# Patient Record
Sex: Female | Born: 1985 | Race: White | Hispanic: No | Marital: Married | Smoking: Never smoker
Health system: Southern US, Community
[De-identification: ages and names within clinical notes are randomized; demographics above are authoritative.]

## PROBLEM LIST (undated history)

## (undated) ENCOUNTER — Inpatient Hospital Stay (HOSPITAL_COMMUNITY): Payer: Self-pay

## (undated) DIAGNOSIS — E119 Type 2 diabetes mellitus without complications: Secondary | ICD-10-CM

## (undated) DIAGNOSIS — O24419 Gestational diabetes mellitus in pregnancy, unspecified control: Secondary | ICD-10-CM

## (undated) DIAGNOSIS — J45909 Unspecified asthma, uncomplicated: Secondary | ICD-10-CM

## (undated) HISTORY — PX: UPPER GI ENDOSCOPY: SHX6162

## (undated) HISTORY — DX: Unspecified asthma, uncomplicated: J45.909

## (undated) HISTORY — PX: COLONOSCOPY: SHX174

---
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
## 2017-01-31 ENCOUNTER — Ambulatory Visit (INDEPENDENT_AMBULATORY_CARE_PROVIDER_SITE_OTHER): Admitting: Obstetrics & Gynecology

## 2017-01-31 ENCOUNTER — Encounter (INDEPENDENT_AMBULATORY_CARE_PROVIDER_SITE_OTHER): Payer: Self-pay

## 2017-01-31 ENCOUNTER — Encounter: Payer: Self-pay | Admitting: Obstetrics & Gynecology

## 2017-01-31 VITALS — BP 116/71 | HR 100 | Ht 66.0 in | Wt 149.0 lb

## 2017-01-31 DIAGNOSIS — Z3481 Encounter for supervision of other normal pregnancy, first trimester: Secondary | ICD-10-CM | POA: Diagnosis not present

## 2017-01-31 DIAGNOSIS — Z3480 Encounter for supervision of other normal pregnancy, unspecified trimester: Secondary | ICD-10-CM

## 2017-01-31 DIAGNOSIS — Z113 Encounter for screening for infections with a predominantly sexual mode of transmission: Secondary | ICD-10-CM

## 2017-01-31 DIAGNOSIS — Z3687 Encounter for antenatal screening for uncertain dates: Secondary | ICD-10-CM | POA: Diagnosis not present

## 2017-01-31 DIAGNOSIS — Z3491 Encounter for supervision of normal pregnancy, unspecified, first trimester: Secondary | ICD-10-CM

## 2017-01-31 DIAGNOSIS — O0993 Supervision of high risk pregnancy, unspecified, third trimester: Secondary | ICD-10-CM | POA: Insufficient documentation

## 2017-01-31 MED ORDER — PROMETHAZINE HCL 25 MG PO TABS: 25.0000 mg | ORAL_TABLET | Freq: Four times a day (QID) | ORAL | 2 refills | Status: DC | PRN

## 2017-01-31 NOTE — Progress Notes (Signed)
Last pap 2017- normal results

## 2017-01-31 NOTE — Progress Notes (Signed)
Bedside U/S shows IUP with FHT of 163 BPM and CRL 27.448mm

## 2017-01-31 NOTE — Progress Notes (Signed)
  Subjective:    Caroline Williamson is being seen today for her first obstetrical visit.  This is a planned pregnancy. She is at 2773w4d gestation. Her obstetrical history is significant for none. Relationship with FOB: significant other, not living together. Patient does intend to breast feed. Pregnancy history fully reviewed.  Patient reports constipation and nausea..  Review of Systems:   Review of Systems  Objective:     BP 116/71   Pulse 100   Ht 5\' 6"  (1.676 m)   Wt 149 lb (67.6 kg)   LMP 11/18/2016   BMI 24.05 kg/m  Physical Exam  Exam    Assessment:    Pregnancy: [id number] Patient Active Problem List   Diagnosis Date Noted  . Supervision of other normal pregnancy, antepartum 01/31/2017       Plan:     Initial labs drawn. Prenatal vitamins. Problem list reviewed and updated. AFP3 discussed: declined. Role of ultrasound in pregnancy discussed; fetal survey: requested. Amniocentesis discussed: not indicated. Follow up in 4 weeks. Rec Miralax Phenergan prn   Caroline Williamson 01/31/2017

## 2017-02-02 LAB — CULTURE, URINE COMPREHENSIVE
MICRO NUMBER:: 81259460
SPECIMEN QUALITY:: ADEQUATE

## 2017-02-04 LAB — GC/CHLAMYDIA PROBE AMP (~~LOC~~) NOT AT ARMC
CHLAMYDIA, DNA PROBE: NEGATIVE
NEISSERIA GONORRHEA: NEGATIVE

## 2017-02-08 LAB — CYSTIC FIBROSIS DIAGNOSTIC STUDY

## 2017-02-08 LAB — OBSTETRIC PANEL
ANTIBODY SCREEN: NOT DETECTED
HEP B S AG: NONREACTIVE
RPR Ser Ql: NONREACTIVE

## 2017-02-08 LAB — HIV ANTIBODY (ROUTINE TESTING W REFLEX): HIV 1&2 Ab, 4th Generation: NONREACTIVE

## 2017-03-01 ENCOUNTER — Ambulatory Visit (INDEPENDENT_AMBULATORY_CARE_PROVIDER_SITE_OTHER): Admitting: Certified Nurse Midwife

## 2017-03-01 VITALS — BP 115/78 | HR 103 | Wt 151.0 lb

## 2017-03-01 DIAGNOSIS — O09899 Supervision of other high risk pregnancies, unspecified trimester: Secondary | ICD-10-CM

## 2017-03-01 DIAGNOSIS — Z2839 Other underimmunization status: Secondary | ICD-10-CM | POA: Insufficient documentation

## 2017-03-01 DIAGNOSIS — O9989 Other specified diseases and conditions complicating pregnancy, childbirth and the puerperium: Secondary | ICD-10-CM

## 2017-03-01 DIAGNOSIS — Z3480 Encounter for supervision of other normal pregnancy, unspecified trimester: Secondary | ICD-10-CM

## 2017-03-01 DIAGNOSIS — Z3482 Encounter for supervision of other normal pregnancy, second trimester: Secondary | ICD-10-CM

## 2017-03-01 DIAGNOSIS — Z283 Underimmunization status: Secondary | ICD-10-CM

## 2017-03-01 NOTE — Patient Instructions (Signed)
Second Trimester of Pregnancy The second trimester is from week 13 through week 28, month 4 through 6. This is often the time in pregnancy that you feel your best. Often times, morning sickness has lessened or quit. You may have more energy, and you may get hungry more often. Your unborn baby (fetus) is growing rapidly. At the end of the sixth month, he or she is about 9 inches long and weighs about 1 pounds. You will likely feel the baby move (quickening) between 18 and 20 weeks of pregnancy. Follow these instructions at home:  Avoid all smoking, herbs, and alcohol. Avoid drugs not approved by your doctor.  Do not use any tobacco products, including cigarettes, chewing tobacco, and electronic cigarettes. If you need help quitting, ask your doctor. You may get counseling or other support to help you quit.  Only take medicine as told by your doctor. Some medicines are safe and some are not during pregnancy.  Exercise only as told by your doctor. Stop exercising if you start having cramps.  Eat regular, healthy meals.  Wear a good support bra if your breasts are tender.  Do not use hot tubs, steam rooms, or saunas.  Wear your seat belt when driving.  Avoid raw meat, uncooked cheese, and liter boxes and soil used by cats.  Take your prenatal vitamins.  Take 1500-2000 milligrams of calcium daily starting at the 20th week of pregnancy until you deliver your baby.  Try taking medicine that helps you poop (stool softener) as needed, and if your doctor approves. Eat more fiber by eating fresh fruit, vegetables, and whole grains. Drink enough fluids to keep your pee (urine) clear or pale yellow.  Take warm water baths (sitz baths) to soothe pain or discomfort caused by hemorrhoids. Use hemorrhoid cream if your doctor approves.  If you have puffy, bulging veins (varicose veins), wear support hose. Raise (elevate) your feet for 15 minutes, 3-4 times a day. Limit salt in your diet.  Avoid heavy  lifting, wear low heals, and sit up straight.  Rest with your legs raised if you have leg cramps or low back pain.  Visit your dentist if you have not gone during your pregnancy. Use a soft toothbrush to brush your teeth. Be gentle when you floss.  You can have sex (intercourse) unless your doctor tells you not to.  Go to your doctor visits. Get help if:  You feel dizzy.  You have mild cramps or pressure in your lower belly (abdomen).  You have a nagging pain in your belly area.  You continue to feel sick to your stomach (nauseous), throw up (vomit), or have watery poop (diarrhea).  You have bad smelling fluid coming from your vagina.  You have pain with peeing (urination). Get help right away if:  You have a fever.  You are leaking fluid from your vagina.  You have spotting or bleeding from your vagina.  You have severe belly cramping or pain.  You lose or gain weight rapidly.  You have trouble catching your breath and have chest pain.  You notice sudden or extreme puffiness (swelling) of your face, hands, ankles, feet, or legs.  You have not felt the baby move in over an hour.  You have severe headaches that do not go away with medicine.  You have vision changes. This information is not intended to replace advice given to you by your health care provider. Make sure you discuss any questions you have with your health care   provider. Document Released: 06/06/2009 Document Revised: 08/18/2015 Document Reviewed: 05/13/2012 Elsevier Interactive Patient Education  2017 [employer]vier Inc[employer]  

## 2017-03-01 NOTE — Progress Notes (Signed)
Subjective:  Caroline Williamson is a 31 y.o. G3P2002 at 31w5dbeing seen today for ongoing prenatal care.  She is currently monitored for the following issues for this low-risk pregnancy and has Supervision of other normal pregnancy, antepartum and Rubella non-immune status, antepartum on their problem list.  Patient reports no complaints.   . Vag. Bleeding: None.   . Denies leaking of fluid.   The following portions of the patient's history were reviewed and updated as appropriate: allergies, current medications, past family history, past medical history, past social history, past surgical history and problem list. Problem list updated.  Objective:   Vitals:   03/01/17 1045  BP: 115/78  Pulse: (!) 103  Weight: 151 lb (68.5 kg)    Fetal Status: Fetal Heart Rate (bpm): 157         General:  Alert, oriented and cooperative. Patient is in no acute distress.  Skin: Skin is warm and dry. No rash noted.   Cardiovascular: Normal heart rate noted  Respiratory: Normal respiratory effort, no problems with respiration noted  Abdomen: Soft, gravid, appropriate for gestational age. Pain/Pressure: Absent     Pelvic: Vag. Bleeding: None Vag D/C Character: Thin   Cervical exam deferred        Extremities: Normal range of motion.  Edema: None  Mental Status: Normal mood and affect. Normal behavior. Normal judgment and thought content.   Urinalysis: Urine Protein: Negative Urine Glucose: Negative  Assessment and Plan:  Pregnancy: G3P2002 at 12w5d1. Rubella non-immune status, antepartum - MMR pp  2. Encounter for supervision of other normal pregnancy in second trimester - [place] OB COMP + 14 WK; Future - nausea resolving, changing diet helped, B6 prn  Preterm labor symptoms and general obstetric precautions including but not limited to vaginal bleeding, contractions, leaking of fluid and fetal movement were reviewed in detail with the patient. Please refer to After Visit Summary for other  counseling recommendations.  Return in about 4 weeks (around 03/29/2017).   BhJulianne HandlerCNM

## 2017-03-26 NOTE — L&D Delivery Note (Signed)
Patient is 32 y.o. [id number] 10161w2d admitted IOL for A2GDM, EFW 8 lb 4 oz (>90%) @ 36.2. S/P FB in office, IV pitocin,     Delivery Note At 8:32 PM a viable female was delivered via  (Presentation: OA).  APGAR: pending; Placenta status: delivered intact .  Cord:3V   Anesthesia:  Epidural Episiotomy:  No Lacerations:  None Est. Blood Loss (mL):  10 mL  Upon arrival patient was complete and pushing. She pushed with good maternal effort to deliver a healthy baby girl. Baby delivered without difficulty, was noted to have good tone and place on maternal abdomen for oral suctioning, drying and stimulation. Delayed cord clamping performed. Placenta delivered intact with 3V cord. Vaginal canal and perineum was inspected and no lacerations were observed; hemostatic. Pitocin was started and uterus massaged until bleeding slowed. Counts of sharps, instruments, and lap pads were all correct.   Mom to postpartum.  Baby to Couplet care / Skin to Skin.  Garnette Gunneraron B Thompson 08/27/2017, 8:43 PM

## 2017-03-29 ENCOUNTER — Encounter: Payer: Self-pay | Admitting: Certified Nurse Midwife

## 2017-03-29 ENCOUNTER — Ambulatory Visit (INDEPENDENT_AMBULATORY_CARE_PROVIDER_SITE_OTHER): Payer: Self-pay | Admitting: Certified Nurse Midwife

## 2017-03-29 DIAGNOSIS — J4521 Mild intermittent asthma with (acute) exacerbation: Secondary | ICD-10-CM

## 2017-03-29 DIAGNOSIS — J45901 Unspecified asthma with (acute) exacerbation: Secondary | ICD-10-CM | POA: Insufficient documentation

## 2017-03-29 DIAGNOSIS — Z3480 Encounter for supervision of other normal pregnancy, unspecified trimester: Secondary | ICD-10-CM

## 2017-03-29 MED ORDER — ALBUTEROL SULFATE HFA 108 (90 BASE) MCG/ACT IN AERS: 1.0000 | INHALATION_SPRAY | Freq: Four times a day (QID) | RESPIRATORY_TRACT | 2 refills | Status: AC | PRN

## 2017-03-29 NOTE — Progress Notes (Signed)
`  Subjective:  Caroline Williamson is a 32 y.o. G3P2002 at 668w5d being seen today for ongoing prenatal care.  She is currently monitored for the following issues for this low-risk pregnancy and has Supervision of other normal pregnancy, antepartum; Rubella non-immune status, antepartum; and Asthma exacerbation on their problem list.  Patient reports SOB and chest tightness over the last week. Nasal congestion and nonproductive cough present. No fevers, chills, or body aches..  Contractions: Not present. Vag. Bleeding: None.  Movement: Present. Denies leaking of fluid.   The following portions of the patient's history were reviewed and updated as appropriate: allergies, current medications, past family history, past medical history, past social history, past surgical history and problem list. Problem list updated.  Objective:   Vitals:   03/29/17 0842  BP: 126/76  Pulse: (!) 109  Weight: 158 lb (71.7 kg)    Fetal Status: Fetal Heart Rate (bpm): 148 Fundal Height: 17 cm Movement: Present     General:  Alert, oriented and cooperative. Patient is in no acute distress.  Skin: Skin is warm and dry. No rash noted.   Cardiovascular: RRR  Respiratory: Normal respiratory effort, CTAB  Abdomen: Soft, gravid, appropriate for gestational age. Pain/Pressure: Absent     Pelvic: Vag. Bleeding: None Vag D/C Character: Thin   Cervical exam deferred        Extremities: Normal range of motion.  Edema: None  Mental Status: Normal mood and affect. Normal behavior. Normal judgment and thought content.   Urinalysis: Urine Protein: 1+ Urine Glucose: Negative  Assessment and Plan:  Pregnancy: G3P2002 at 608w5d  1. Supervision of other normal pregnancy, antepartum - Second trimester anticipatory guidance  2. Mild intermittent asthma with exacerbation - Hx of asthma - Rx Proventil inhaler prn  Preterm labor symptoms and general obstetric precautions including but not limited to vaginal bleeding, contractions,  leaking of fluid and fetal movement were reviewed in detail with the patient. Please refer to After Visit Summary for other counseling recommendations.  Return in about 4 weeks (around 04/26/2017).   Donette LarryBhambri, Arlene Genova, CNM

## 2017-03-29 NOTE — Progress Notes (Signed)
Pt c/o having a  hard time breathing

## 2017-04-03 ENCOUNTER — Encounter (HOSPITAL_COMMUNITY): Payer: Self-pay | Admitting: Certified Nurse Midwife

## 2017-04-08 ENCOUNTER — Other Ambulatory Visit (HOSPITAL_COMMUNITY)

## 2017-04-26 ENCOUNTER — Encounter: Payer: Self-pay | Admitting: Advanced Practice Midwife

## 2017-06-20 ENCOUNTER — Ambulatory Visit (INDEPENDENT_AMBULATORY_CARE_PROVIDER_SITE_OTHER): Payer: Medicaid Other | Admitting: Obstetrics & Gynecology

## 2017-06-20 VITALS — BP 128/77 | HR 115 | Wt 172.0 lb

## 2017-06-20 DIAGNOSIS — Z3482 Encounter for supervision of other normal pregnancy, second trimester: Secondary | ICD-10-CM | POA: Diagnosis not present

## 2017-06-20 DIAGNOSIS — Z3480 Encounter for supervision of other normal pregnancy, unspecified trimester: Secondary | ICD-10-CM

## 2017-06-20 NOTE — Progress Notes (Signed)
   PRENATAL VISIT NOTE  Subjective:  Caroline Williamson is a 32 y.o. G3P2002 at 8271w4d being seen today for ongoing prenatal care.  She is currently monitored for the following issues for this low-risk pregnancy and has Supervision of other normal pregnancy, antepartum; Rubella non-immune status, antepartum; and Asthma exacerbation on their problem list.  Patient reports some contractions, lots of stress.  Contractions: Not present. Vag. Bleeding: None.  Movement: Present. Denies leaking of fluid.   The following portions of the patient's history were reviewed and updated as appropriate: allergies, current medications, past family history, past medical history, past social history, past surgical history and problem list. Problem list updated.  Objective:   Vitals:   06/20/17 1342  BP: 128/77  Pulse: (!) 115  Weight: 172 lb (78 kg)    Fetal Status:     Movement: Present     General:  Alert, oriented and cooperative. Patient is in no acute distress.  Skin: Skin is warm and dry. No rash noted.   Cardiovascular: Normal heart rate noted  Respiratory: Normal respiratory effort, no problems with respiration noted  Abdomen: Soft, gravid, appropriate for gestational age.  Pain/Pressure: Present     Pelvic: Cervical exam performed        Extremities: Normal range of motion.  Edema: Trace  Mental Status:  Normal mood and affect. Normal behavior. Normal judgment and thought content.   Assessment and Plan:  Pregnancy: G3P2002 at 5571w4d  1. Supervision of other normal pregnancy, antepartum  - CBC - HIV antibody (with reflex) - RPR - Glucose Tolerance, 1 HR (50g)  Preterm labor symptoms and general obstetric precautions including but not limited to vaginal bleeding, contractions, leaking of fluid and fetal movement were reviewed in detail with the patient. Please refer to After Visit Summary for other counseling recommendations.  No follow-ups on file.   Allie BossierMyra C Berdell Hostetler, MD

## 2017-06-21 ENCOUNTER — Telehealth: Payer: Self-pay | Admitting: *Deleted

## 2017-06-21 LAB — CBC
HEMATOCRIT: 28 % — AB (ref 35.0–45.0)
HEMOGLOBIN: 9.2 g/dL — AB (ref 11.7–15.5)
MCH: 26.1 pg — AB (ref 27.0–33.0)
MCHC: 32.9 g/dL (ref 32.0–36.0)
MCV: 79.3 fL — ABNORMAL LOW (ref 80.0–100.0)
MPV: 10.1 fL (ref 7.5–12.5)
Platelets: 349 10*3/uL (ref 140–400)
RBC: 3.53 10*6/uL — AB (ref 3.80–5.10)
RDW: 13 % (ref 11.0–15.0)
WBC: 8.7 10*3/uL (ref 3.8–10.8)

## 2017-06-21 LAB — RPR: RPR Ser Ql: NONREACTIVE

## 2017-06-21 LAB — HIV ANTIBODY (ROUTINE TESTING W REFLEX): HIV 1&2 Ab, 4th Generation: NONREACTIVE

## 2017-06-21 LAB — GLUCOSE TOLERANCE, 1 HOUR (50G) W/O FASTING: Glucose, 1 Hr, gestational: 166 mg/dL — ABNORMAL HIGH (ref 65–139)

## 2017-06-21 NOTE — Telephone Encounter (Signed)
-----   Message from Allie BossierMyra C Dove, MD sent at 06/21/2017  7:57 AM EDT ----- Abnormal 1 hour  I recommend a 3 hour GTT

## 2017-06-21 NOTE — Telephone Encounter (Signed)
Pt notified of abnormal 1 hr GTT.  Pt is to come in Tuesday morning for a fasting 3 hr GTT per Dr Marice Potterove.

## 2017-06-24 ENCOUNTER — Telehealth: Payer: Self-pay | Admitting: *Deleted

## 2017-06-24 NOTE — Telephone Encounter (Signed)
-----   Message from Allie BossierMyra C Dove, MD sent at 06/24/2017  8:16 AM EDT ----- Please recommend that she take iron daily. Thanks

## 2017-06-25 ENCOUNTER — Other Ambulatory Visit: Payer: Medicaid Other

## 2017-06-25 DIAGNOSIS — R7309 Other abnormal glucose: Secondary | ICD-10-CM

## 2017-06-26 LAB — GLUCOSE TOLERANCE, 3 HOURS
GLUCOSE 3 HOUR GTT: 136 mg/dL (ref 65–144)
GLUCOSE, 1 HOUR-GESTATIONAL: 291 mg/dL — AB (ref 65–189)
GLUCOSE, FASTING-GESTATIONAL: 111 mg/dL — AB (ref 65–104)
Glucose Tolerance, 2 hour: 233 mg/dL — ABNORMAL HIGH (ref 65–164)

## 2017-06-27 ENCOUNTER — Ambulatory Visit (INDEPENDENT_AMBULATORY_CARE_PROVIDER_SITE_OTHER): Payer: Medicaid Other | Admitting: Obstetrics & Gynecology

## 2017-06-27 VITALS — BP 114/69 | HR 104 | Wt 173.0 lb

## 2017-06-27 DIAGNOSIS — O24414 Gestational diabetes mellitus in pregnancy, insulin controlled: Secondary | ICD-10-CM

## 2017-06-27 MED ORDER — ACCU-CHEK NANO SMARTVIEW W/DEVICE KIT: 1.0000 | PACK | 0 refills | Status: DC

## 2017-06-27 MED ORDER — ACCU-CHEK FASTCLIX LANCETS MISC: 1.0000 [IU] | Freq: Four times a day (QID) | 12 refills | Status: DC

## 2017-06-27 MED ORDER — INSULIN REGULAR HUMAN 100 UNIT/ML IJ SOLN: INTRAMUSCULAR | 11 refills | Status: DC

## 2017-06-27 MED ORDER — GLUCOSE BLOOD VI STRP: ORAL_STRIP | 12 refills | Status: DC

## 2017-06-27 MED ORDER — INSULIN SYRINGES (DISPOSABLE) U-100 0.5 ML MISC: 1.0000 | Freq: Three times a day (TID) | 12 refills | Status: DC

## 2017-06-27 MED ORDER — INSULIN NPH (HUMAN) (ISOPHANE) 100 UNIT/ML ~~LOC~~ SUSP: SUBCUTANEOUS | 3 refills | Status: DC

## 2017-06-27 NOTE — Addendum Note (Signed)
Addended by: Allie BossierVE, [place] C on: 06/27/2017 01:46 PM   Modules accepted: Orders

## 2017-06-27 NOTE — Progress Notes (Signed)
I have ordered weekly BPP starting at 32 weeks She never got her anatomy u/s done, so I have re ordered it.

## 2017-06-27 NOTE — Progress Notes (Signed)
She is here because her 3 hour results were so high that she needs insulin. Based on her weight, she will need 30 units of NPH in the morning and 11 of NPH at bedtime + 16 regular with breakfast and 11 with supper.

## 2017-07-04 ENCOUNTER — Ambulatory Visit (INDEPENDENT_AMBULATORY_CARE_PROVIDER_SITE_OTHER): Payer: Medicaid Other | Admitting: Obstetrics & Gynecology

## 2017-07-04 VITALS — BP 123/74 | HR 106 | Wt 171.0 lb

## 2017-07-04 DIAGNOSIS — O24414 Gestational diabetes mellitus in pregnancy, insulin controlled: Secondary | ICD-10-CM | POA: Diagnosis not present

## 2017-07-04 DIAGNOSIS — Z3480 Encounter for supervision of other normal pregnancy, unspecified trimester: Secondary | ICD-10-CM

## 2017-07-04 NOTE — Progress Notes (Signed)
   PRENATAL VISIT NOTE  Subjective:  Caroline Williamson is a 32 y.o. G3P2002 at 3127w4d being seen today for ongoing prenatal care.  She is currently monitored for the following issues for this high-risk pregnancy and has Supervision of other normal pregnancy, antepartum; Rubella non-immune status, antepartum; and Asthma exacerbation on their problem list.  Patient reports no complaints.  Contractions: Not present. Vag. Bleeding: None.  Movement: Present. Denies leaking of fluid.   The following portions of the patient's history were reviewed and updated as appropriate: allergies, current medications, past family history, past medical history, past social history, past surgical history and problem list. Problem list updated.  Objective:   Vitals:   07/04/17 1350  BP: 123/74  Pulse: (!) 106  Weight: 171 lb (77.6 kg)    Fetal Status: Fetal Heart Rate (bpm): 147   Movement: Present     General:  Alert, oriented and cooperative. Patient is in no acute distress.  Skin: Skin is warm and dry. No rash noted.   Cardiovascular: Normal heart rate noted  Respiratory: Normal respiratory effort, no problems with respiration noted  Abdomen: Soft, gravid, appropriate for gestational age.  Pain/Pressure: Present     Pelvic: Cervical exam deferred        Extremities: Normal range of motion.  Edema: Trace  Mental Status: Normal mood and affect. Normal behavior. Normal judgment and thought content.   Assessment and Plan:  Pregnancy: G3P2002 at 3127w4d  1. Supervision of other normal pregnancy, antepartum 2. Insulin dependent GDM- anatomy u/s scheduled for 07-10-17, BPP ordered and scheduled for 07-10-17 - She is scheduled for diabetes teaching 07-17-17  Preterm labor symptoms and general obstetric precautions including but not limited to vaginal bleeding, contractions, leaking of fluid and fetal movement were reviewed in detail with the patient. Please refer to After Visit Summary for other counseling  recommendations.  No follow-ups on file.  Future Appointments  Date Time Provider Department Center  07/10/2017  8:00 AM [facility] US 3 [facility]  07/17/2017  8:45 AM [facility] GDM CLASS [facility] NDM    Caroline BossierMyra C Kaila Devries, MD

## 2017-07-05 LAB — UNLABELED: TEST ORDERED ON REQ: 16802

## 2017-07-08 ENCOUNTER — Encounter (HOSPITAL_COMMUNITY): Payer: Self-pay

## 2017-07-10 ENCOUNTER — Ambulatory Visit (HOSPITAL_COMMUNITY)
Admission: RE | Admit: 2017-07-10 | Discharge: 2017-07-10 | Disposition: A | Discharge status: Home / Self Care | Payer: Medicaid Other | Source: Ambulatory Visit | Attending: Obstetrics & Gynecology | Admitting: Obstetrics & Gynecology

## 2017-07-10 ENCOUNTER — Other Ambulatory Visit: Payer: Self-pay | Admitting: Obstetrics & Gynecology

## 2017-07-10 DIAGNOSIS — Z3689 Encounter for other specified antenatal screening: Secondary | ICD-10-CM

## 2017-07-10 DIAGNOSIS — Z3A33 33 weeks gestation of pregnancy: Secondary | ICD-10-CM

## 2017-07-10 DIAGNOSIS — O24414 Gestational diabetes mellitus in pregnancy, insulin controlled: Secondary | ICD-10-CM

## 2017-07-10 HISTORY — DX: Type 2 diabetes mellitus without complications: E11.9

## 2017-07-10 HISTORY — DX: Gestational diabetes mellitus in pregnancy, unspecified control: O24.419

## 2017-07-10 LAB — PAT ID TIQ DOC: Test Affected: 16802

## 2017-07-10 LAB — HEMOGLOBIN A1C
HEMOGLOBIN A1C: 6.4 %{Hb} — AB (ref <5.7)
MEAN PLASMA GLUCOSE: 137 (calc)
eAG (mmol/L): 7.6 (calc)

## 2017-07-17 ENCOUNTER — Telehealth: Payer: Self-pay | Admitting: *Deleted

## 2017-07-17 ENCOUNTER — Encounter: Payer: Self-pay | Admitting: Registered"

## 2017-07-17 ENCOUNTER — Encounter: Payer: Medicaid Other | Attending: Obstetrics & Gynecology | Admitting: Registered"

## 2017-07-17 DIAGNOSIS — Z713 Dietary counseling and surveillance: Secondary | ICD-10-CM | POA: Diagnosis present

## 2017-07-17 DIAGNOSIS — O24414 Gestational diabetes mellitus in pregnancy, insulin controlled: Secondary | ICD-10-CM | POA: Diagnosis not present

## 2017-07-17 DIAGNOSIS — Z3A00 Weeks of gestation of pregnancy not specified: Secondary | ICD-10-CM | POA: Diagnosis not present

## 2017-07-17 DIAGNOSIS — O99810 Abnormal glucose complicating pregnancy: Secondary | ICD-10-CM

## 2017-07-17 MED ORDER — GLUCOSE BLOOD VI STRP: ORAL_STRIP | 12 refills | Status: DC

## 2017-07-17 NOTE — Progress Notes (Signed)
Patient was seen on 07/17/17 for Gestational Diabetes self-management class at [facility]. The following learning objectives were met by the patient during this course:   States the definition of Gestational Diabetes  States why dietary management is important in controlling blood glucose  Describes the effects each nutrient has on blood glucose levels  Demonstrates ability to create a balanced meal plan  Demonstrates carbohydrate counting   States when to check blood glucose levels  Demonstrates proper blood glucose monitoring techniques  States the effect of stress and exercise on blood glucose levels  States the importance of limiting caffeine and abstaining from alcohol and smoking  Blood glucose monitor given: Accu-chek Guide Lot # [id number] Exp: 06/29/2018 Blood glucose reading: 112  Patient instructed to monitor glucose levels: FBS: 60 - <95; 1 hour: <140; 2 hour: <120  Patient received handouts:  Nutrition Diabetes and Pregnancy, including carb counting list  Patient will be seen for follow-up as needed.

## 2017-07-17 NOTE — Telephone Encounter (Signed)
RX for Accu-chek Glucose monitor strips sent to pt's pharmacy with RF's.

## 2017-07-18 ENCOUNTER — Ambulatory Visit: Payer: Medicaid Other | Admitting: *Deleted

## 2017-07-18 DIAGNOSIS — O24414 Gestational diabetes mellitus in pregnancy, insulin controlled: Secondary | ICD-10-CM

## 2017-07-18 NOTE — Progress Notes (Signed)
Pt here for insulin injection teaching.  Reviewed with patient the mixing and draw up of meds and when to take her insulin.  Pt returned demonstration and states that she feels much better about doing the injections.  She stated that the needle was so small she barely felt it.  Pt to return tomorrow for a ROB appt.

## 2017-07-19 ENCOUNTER — Encounter: Payer: Self-pay | Admitting: Advanced Practice Midwife

## 2017-07-19 ENCOUNTER — Ambulatory Visit (INDEPENDENT_AMBULATORY_CARE_PROVIDER_SITE_OTHER): Payer: Medicaid Other | Admitting: Advanced Practice Midwife

## 2017-07-19 VITALS — BP 120/71 | HR 118 | Wt 174.0 lb

## 2017-07-19 DIAGNOSIS — Z8632 Personal history of gestational diabetes: Secondary | ICD-10-CM | POA: Insufficient documentation

## 2017-07-19 DIAGNOSIS — Z3480 Encounter for supervision of other normal pregnancy, unspecified trimester: Secondary | ICD-10-CM

## 2017-07-19 DIAGNOSIS — O24414 Gestational diabetes mellitus in pregnancy, insulin controlled: Secondary | ICD-10-CM

## 2017-07-19 DIAGNOSIS — O0993 Supervision of high risk pregnancy, unspecified, third trimester: Secondary | ICD-10-CM

## 2017-07-19 NOTE — Progress Notes (Signed)
   PRENATAL VISIT NOTE  Subjective:  Caroline Williamson is a 32 y.o. G3P2002 at 6993w5d being seen today for ongoing prenatal care.  She is currently monitored for the following issues for this high-risk pregnancy and has Supervision of other normal pregnancy, antepartum; Rubella non-immune status, antepartum; Asthma exacerbation; and Gestational diabetes mellitus in pregnancy, insulin controlled on their problem list.   Just started insulin yesterday. Current dose: NPH 30 q am, Regular 16qam, and NPH 11qhs, Regular 11qhs  Patient reports edema.  Contractions: Not present. Vag. Bleeding: None.  Movement: Present. Denies leaking of fluid.   The following portions of the patient's history were reviewed and updated as appropriate: allergies, current medications, past family history, past medical history, past social history, past surgical history and problem list. Problem list updated.  Objective:   Vitals:   07/19/17 0956  BP: 120/71  Pulse: (!) 118  Weight: 174 lb (78.9 kg)    Fetal Status: Fetal Heart Rate (bpm): 157 Fundal Height: 35 cm Movement: Present     General:  Alert, oriented and cooperative. Patient is in no acute distress.  Skin: Skin is warm and dry. No rash noted.   Cardiovascular: Normal heart rate noted  Respiratory: Normal respiratory effort, no problems with respiration noted  Abdomen: Soft, gravid, appropriate for gestational age.  Pain/Pressure: Present     Pelvic: Cervical exam deferred        Extremities: Normal range of motion.  Edema: Trace  Mental Status: Normal mood and affect. Normal behavior. Normal judgment and thought content.    CBG log: Fasting: 113, 93 PP: 112, 123, 86, 124, 78, 94, 94, 94, 94  NST today FHT: 145, moderate with 15x15 accels,  Toco: no UCs  Assessment and Plan:  Pregnancy: G3P2002 at 4293w5d  1. Supervision of other normal pregnancy, antepartum  2. Insulin controlled gestational diabetes mellitus (GDM) in third trimester - Needs to  start antenatal testing. Patient would like to do testing in our office.  - FU US with MFM in about 2 weeks.   Preterm labor symptoms and general obstetric precautions including but not limited to vaginal bleeding, contractions, leaking of fluid and fetal movement were reviewed in detail with the patient. Please refer to After Visit Summary for other counseling recommendations.  Return in about 1 week (around 07/26/2017).  No future appointments.  Thressa ShellerHeather Lenae Wherley, CNM

## 2017-07-23 ENCOUNTER — Ambulatory Visit (INDEPENDENT_AMBULATORY_CARE_PROVIDER_SITE_OTHER): Payer: Medicaid Other | Admitting: Obstetrics and Gynecology

## 2017-07-23 ENCOUNTER — Encounter: Payer: Self-pay | Admitting: Obstetrics and Gynecology

## 2017-07-23 VITALS — BP 127/68 | HR 126

## 2017-07-23 DIAGNOSIS — O0993 Supervision of high risk pregnancy, unspecified, third trimester: Secondary | ICD-10-CM | POA: Diagnosis not present

## 2017-07-23 DIAGNOSIS — O24414 Gestational diabetes mellitus in pregnancy, insulin controlled: Secondary | ICD-10-CM

## 2017-07-23 NOTE — Progress Notes (Signed)
   PRENATAL VISIT NOTE  Subjective:  Caroline Williamson is a 32 y.o. G3P2002 at [redacted]w[redacted]d being seen today for ongoing prenatal care.  She is currently monitored for the following issues for this high-risk pregnancy and has Supervision of high risk pregnancy, antepartum, third trimester; Rubella non-immune status, antepartum; Asthma exacerbation; and Gestational diabetes mellitus in pregnancy, insulin controlled on their problem list.  Patient reports no complaints.  Contractions: Not present. Vag. Bleeding: None.  Movement: Present. Denies leaking of fluid.   The following portions of the patient's history were reviewed and updated as appropriate: allergies, current medications, past family history, past medical history, past social history, past surgical history and problem list. Problem list updated.  Objective:   Vitals:   07/23/17 1313  BP: 127/68  Pulse: (!) 126    Fetal Status: Fetal Heart Rate (bpm): NST   Movement: Present     General:  Alert, oriented and cooperative. Patient is in no acute distress.  Skin: Skin is warm and dry. No rash noted.   Cardiovascular: Normal heart rate noted  Respiratory: Normal respiratory effort, no problems with respiration noted  Abdomen: Soft, gravid, appropriate for gestational age.  Pain/Pressure: Present     Pelvic: Cervical exam deferred        Extremities: Normal range of motion.  Edema: Trace  Mental Status: Normal mood and affect. Normal behavior. Normal judgment and thought content.   Assessment and Plan:  Pregnancy: G3P2002 at [redacted]w[redacted]d  1. Supervision of high risk pregnancy, antepartum, third trimester Patient is doing well without complaints  2. Insulin controlled gestational diabetes mellitus (GDM) in third trimester CBGs reviewed and all within range with the exception of the day of her baby shower Continue current insulin regimen Follow up growth ultrasound in mid-May NST reviewed and reactive with baseline 130, mod variability,  +accels, no decels Continue antenatal testing  Preterm labor symptoms and general obstetric precautions including but not limited to vaginal bleeding, contractions, leaking of fluid and fetal movement were reviewed in detail with the patient. Please refer to After Visit Summary for other counseling recommendations.  Return in about 1 week (around 07/30/2017) for ROB, NST, AFI.  Future Appointments  Date Time Provider Department Center  07/26/2017  9:30 AM [facility] NURSE [facility]    Catalina Antigua, MD

## 2017-07-26 ENCOUNTER — Ambulatory Visit (INDEPENDENT_AMBULATORY_CARE_PROVIDER_SITE_OTHER): Payer: Medicaid Other | Admitting: *Deleted

## 2017-07-26 VITALS — BP 113/68 | HR 106

## 2017-07-26 DIAGNOSIS — O24414 Gestational diabetes mellitus in pregnancy, insulin controlled: Secondary | ICD-10-CM

## 2017-07-30 ENCOUNTER — Ambulatory Visit (INDEPENDENT_AMBULATORY_CARE_PROVIDER_SITE_OTHER): Payer: Medicaid Other | Admitting: Obstetrics & Gynecology

## 2017-07-30 VITALS — BP 137/78 | HR 113 | Wt 178.0 lb

## 2017-07-30 DIAGNOSIS — O0993 Supervision of high risk pregnancy, unspecified, third trimester: Secondary | ICD-10-CM

## 2017-07-30 DIAGNOSIS — O24414 Gestational diabetes mellitus in pregnancy, insulin controlled: Secondary | ICD-10-CM | POA: Diagnosis not present

## 2017-07-30 NOTE — Progress Notes (Signed)
   PRENATAL VISIT NOTE  Subjective:  Caroline Williamson is a 32 y.o. G3P2002 at [redacted]w[redacted]d being seen today for ongoing prenatal care.  She is currently monitored for the following issues for this high-risk pregnancy and has Supervision of high risk pregnancy, antepartum, third trimester; Rubella non-immune status, antepartum; Asthma exacerbation; and Gestational diabetes mellitus in pregnancy, insulin controlled on their problem list.  Patient reports no complaints.  Contractions: Not present. Vag. Bleeding: None.  Movement: Present. Denies leaking of fluid.   The following portions of the patient's history were reviewed and updated as appropriate: allergies, current medications, past family history, past medical history, past social history, past surgical history and problem list. Problem list updated.  Objective:   Vitals:   07/30/17 1438  BP: 137/78  Pulse: (!) 113  Weight: 178 lb (80.7 kg)    Fetal Status: Fetal Heart Rate (bpm): NST-R   Movement: Present  Presentation: Vertex  General:  Alert, oriented and cooperative. Patient is in no acute distress.  Skin: Skin is warm and dry. No rash noted.   Cardiovascular: Normal heart rate noted  Respiratory: Normal respiratory effort, no problems with respiration noted  Abdomen: Soft, gravid, appropriate for gestational age.  Pain/Pressure: Present     Pelvic: Cervical exam deferred        Extremities: Normal range of motion.  Edema: Trace  Mental Status: Normal mood and affect. Normal behavior. Normal judgment and thought content.   Assessment and Plan:  Pregnancy: G3P2002 at [redacted]w[redacted]d  1. Supervision of high risk pregnancy, antepartum, third trimester   2. Insulin controlled gestational diabetes mellitus (GDM) in third trimester Her 2 hour after breakfast sugars are in the 60s-70s, so I rec'd that she decrease the short acting from 16 to 12. The rest of her sugars are great. She has MFM u/s on 08-06-17 and gets twice weekly testing  here Cervical cultures at next visit Preterm labor symptoms and general obstetric precautions including but not limited to vaginal bleeding, contractions, leaking of fluid and fetal movement were reviewed in detail with the patient. Please refer to After Visit Summary for other counseling recommendations.  No follow-ups on file.  Future Appointments  Date Time Provider Department Center  08/02/2017 10:00 AM Granville Lewis, RN [facility]  08/06/2017  9:45 AM [facility]-MFC [place] 2 [facility]    Allie Bossier, MD

## 2017-08-02 ENCOUNTER — Ambulatory Visit (INDEPENDENT_AMBULATORY_CARE_PROVIDER_SITE_OTHER): Payer: Medicaid Other | Admitting: *Deleted

## 2017-08-02 DIAGNOSIS — O24414 Gestational diabetes mellitus in pregnancy, insulin controlled: Secondary | ICD-10-CM

## 2017-08-06 ENCOUNTER — Other Ambulatory Visit: Payer: Self-pay | Admitting: Obstetrics and Gynecology

## 2017-08-06 ENCOUNTER — Other Ambulatory Visit: Payer: Medicaid Other

## 2017-08-06 ENCOUNTER — Other Ambulatory Visit: Payer: Self-pay | Admitting: Advanced Practice Midwife

## 2017-08-06 ENCOUNTER — Ambulatory Visit (HOSPITAL_COMMUNITY)
Admission: RE | Admit: 2017-08-06 | Discharge: 2017-08-06 | Disposition: A | Discharge status: Home / Self Care | Payer: Medicaid Other | Source: Ambulatory Visit | Attending: Advanced Practice Midwife | Admitting: Advanced Practice Midwife

## 2017-08-06 DIAGNOSIS — Z3A36 36 weeks gestation of pregnancy: Secondary | ICD-10-CM

## 2017-08-06 DIAGNOSIS — O24414 Gestational diabetes mellitus in pregnancy, insulin controlled: Secondary | ICD-10-CM

## 2017-08-06 DIAGNOSIS — Z362 Encounter for other antenatal screening follow-up: Secondary | ICD-10-CM | POA: Diagnosis present

## 2017-08-06 DIAGNOSIS — Z3480 Encounter for supervision of other normal pregnancy, unspecified trimester: Secondary | ICD-10-CM

## 2017-08-06 DIAGNOSIS — O0993 Supervision of high risk pregnancy, unspecified, third trimester: Secondary | ICD-10-CM

## 2017-08-09 ENCOUNTER — Other Ambulatory Visit (HOSPITAL_COMMUNITY)
Admission: RE | Admit: 2017-08-09 | Discharge: 2017-08-09 | Disposition: A | Discharge status: Home / Self Care | Payer: Medicaid Other | Source: Ambulatory Visit | Attending: Advanced Practice Midwife | Admitting: Advanced Practice Midwife

## 2017-08-09 ENCOUNTER — Ambulatory Visit (INDEPENDENT_AMBULATORY_CARE_PROVIDER_SITE_OTHER): Payer: Medicaid Other | Admitting: Advanced Practice Midwife

## 2017-08-09 VITALS — BP 108/74 | HR 107 | Wt 177.8 lb

## 2017-08-09 DIAGNOSIS — Z3403 Encounter for supervision of normal first pregnancy, third trimester: Secondary | ICD-10-CM | POA: Insufficient documentation

## 2017-08-09 DIAGNOSIS — O3663X0 Maternal care for excessive fetal growth, third trimester, not applicable or unspecified: Secondary | ICD-10-CM

## 2017-08-09 DIAGNOSIS — O9989 Other specified diseases and conditions complicating pregnancy, childbirth and the puerperium: Secondary | ICD-10-CM

## 2017-08-09 DIAGNOSIS — Z283 Underimmunization status: Secondary | ICD-10-CM

## 2017-08-09 DIAGNOSIS — Z362 Encounter for other antenatal screening follow-up: Secondary | ICD-10-CM

## 2017-08-09 DIAGNOSIS — O09899 Supervision of other high risk pregnancies, unspecified trimester: Secondary | ICD-10-CM

## 2017-08-09 DIAGNOSIS — O24414 Gestational diabetes mellitus in pregnancy, insulin controlled: Secondary | ICD-10-CM

## 2017-08-09 DIAGNOSIS — Z8759 Personal history of other complications of pregnancy, childbirth and the puerperium: Secondary | ICD-10-CM | POA: Insufficient documentation

## 2017-08-09 DIAGNOSIS — O0993 Supervision of high risk pregnancy, unspecified, third trimester: Secondary | ICD-10-CM

## 2017-08-09 LAB — OB RESULTS CONSOLE GC/CHLAMYDIA: GC PROBE AMP, GENITAL: NEGATIVE

## 2017-08-09 LAB — GLUCOSE, POCT (MANUAL RESULT ENTRY): POC Glucose: 75 mg/dl (ref 70–99)

## 2017-08-09 LAB — OB RESULTS CONSOLE GBS: STREP GROUP B AG: NEGATIVE

## 2017-08-09 NOTE — Patient Instructions (Signed)
Braxton Hicks Contractions °Contractions of the uterus can occur throughout pregnancy, but they are not always a sign that you are in labor. You may have practice contractions called Braxton Hicks contractions. These false labor contractions are sometimes confused with true labor. °What are Braxton Hicks contractions? °Braxton Hicks contractions are tightening movements that occur in the muscles of the uterus before labor. Unlike true labor contractions, these contractions do not result in opening (dilation) and thinning of the cervix. Toward the end of pregnancy (32-34 weeks), Braxton Hicks contractions can happen more often and may become stronger. These contractions are sometimes difficult to tell apart from true labor because they can be very uncomfortable. You should not feel embarrassed if you go to the hospital with false labor. °Sometimes, the only way to tell if you are in true labor is for your health care provider to look for changes in the cervix. The health care provider will do a physical exam and may monitor your contractions. If you are not in true labor, the exam should show that your cervix is not dilating and your water has not broken. °If there are other health problems associated with your pregnancy, it is completely safe for you to be sent home with false labor. You may continue to have Braxton Hicks contractions until you go into true labor. °How to tell the difference between true labor and false labor °True labor °· Contractions last 30-70 seconds. °· Contractions become very regular. °· Discomfort is usually felt in the top of the uterus, and it spreads to the lower abdomen and low back. °· Contractions do not go away with walking. °· Contractions usually become more intense and increase in frequency. °· The cervix dilates and gets thinner. °False labor °· Contractions are usually shorter and not as strong as true labor contractions. °· Contractions are usually irregular. °· Contractions  are often felt in the front of the lower abdomen and in the groin. °· Contractions may go away when you walk around or change positions while lying down. °· Contractions get weaker and are shorter-lasting as time goes on. °· The cervix usually does not dilate or become thin. °Follow these instructions at home: °· Take over-the-counter and prescription medicines only as told by your health care provider. °· Keep up with your usual exercises and follow other instructions from your health care provider. °· Eat and drink lightly if you think you are going into labor. °· If Braxton Hicks contractions are making you uncomfortable: °? Change your position from lying down or resting to walking, or change from walking to resting. °? Sit and rest in a tub of warm water. °? Drink enough fluid to keep your urine pale yellow. Dehydration may cause these contractions. °? Do slow and deep breathing several times an hour. °· Keep all follow-up prenatal visits as told by your health care provider. This is important. °Contact a health care provider if: °· You have a fever. °· You have continuous pain in your abdomen. °Get help right away if: °· Your contractions become stronger, more regular, and closer together. °· You have fluid leaking or gushing from your vagina. °· You pass blood-tinged mucus (bloody show). °· You have bleeding from your vagina. °· You have low back pain that you never had before. °· You feel your baby’s head pushing down and causing pelvic pressure. °· Your baby is not moving inside you as much as it used to. °Summary °· Contractions that occur before labor are called Braxton   Hicks contractions, false labor, or practice contractions. °· Braxton Hicks contractions are usually shorter, weaker, farther apart, and less regular than true labor contractions. True labor contractions usually become progressively stronger and regular and they become more frequent. °· Manage discomfort from Braxton Hicks contractions by  changing position, resting in a warm bath, drinking plenty of water, or practicing deep breathing. °This information is not intended to replace advice given to you by your health care provider. Make sure you discuss any questions you have with your health care provider. °Document Released: 07/26/2016 Document Revised: 07/26/2016 Document Reviewed: 07/26/2016 °Elsevier Interactive Patient Education © 2018 [employer] ° °

## 2017-08-09 NOTE — Progress Notes (Signed)
   PRENATAL VISIT NOTE  Subjective:  Caroline Williamson is a 32 y.o. G3P2002 at 9w5dbeing seen today for ongoing prenatal care.  She is currently monitored for the following issues for this high-risk pregnancy and has Supervision of high risk pregnancy, antepartum, third trimester; Rubella non-immune status, antepartum; Asthma exacerbation; Gestational diabetes mellitus in pregnancy, insulin controlled; and Fetal macrosomia affecting management of mother, antepartum on their problem list.  Patient reports no complaints.  Contractions: Not present. Vag. Bleeding: None.  Movement: Absent. Denies leaking of fluid.   The following portions of the patient's history were reviewed and updated as appropriate: allergies, current medications, past family history, past medical history, past social history, past surgical history and problem list. Problem list updated.  Objective:   Vitals:   08/09/17 0843  BP: 108/74  Pulse: (!) 107  Weight: 177 lb 12.8 oz (80.6 kg)    Fetal Status: Fetal Heart Rate (bpm): NST-R Fundal Height: 37 cm Movement: Absent  Presentation: Vertex  General:  Alert, oriented and cooperative. Patient is in no acute distress.  Skin: Skin is warm and dry. No rash noted.   Cardiovascular: Normal heart rate noted  Respiratory: Normal respiratory effort, no problems with respiration noted  Abdomen: Soft, gravid, appropriate for gestational age.  Pain/Pressure: Present     Pelvic: Cervical exam performed Dilation: Fingertip Effacement (%): 0 Station: Ballotable  Extremities: Normal range of motion.  Edema: Trace  Mental Status: Normal mood and affect. Normal behavior. Normal judgment and thought content.   EFW 8-4 (>90%), AC > 97% All CBGs in range except 1 fasting. 75 2 hour PC today.   Assessment and Plan:  Pregnancy: G3P2002 at 392w5d1. Encounter for supervision of normal first pregnancy in third trimester  - [facility], beta  strep (group b only) - [place] FOLLOW [place]; Future  2. Insulin controlled gestational diabetes mellitus (GDM) in third trimester-Well-controlled - Lengthy discussion about importance of testing blood sugars, bringing log book and keeping appointments. Discussed risks of uncontrolled DM in pregnancy including delayed fetal lung maturity, IUFD and shoulder dystocia possibly resulting brachial plexus palsy, brain damage, intrapartum death and extensive obstetric lacerations. Patient verbalizes understanding.  - POCT Glucose (CBG) - [place] OB FOLLOW UP; Future  3. Rubella non-immune status, antepartum - MMR PP  4. Supervision of high risk pregnancy, antepartum, third trimester  - [place] OB FOLLOW UP; Future  5. Macrosomia of fetus affecting management of mother in third trimester, single or unspecified fetus - Proven to 8-9. Has had short labors and 3-contractions second stages w/out shoulder dystocia. Discussed recommendation for C/S if EFW is >4500 gm. Pt hopes to avoid C/S, but verbalizes understanding of risks associated w/ shoulder dystocia.  - [place] OB FOLLOW UP; Future  6. Encounter for other antenatal screening follow-up  - [place] OB FOLLOW UP; Future  Term labor symptoms and general obstetric precautions including but not limited to vaginal bleeding, contractions, leaking of fluid and fetal movement were reviewed in detail with the patient. Please refer to After Visit Summary for other counseling recommendations.  Return in about 1 week (around 08/16/2017) [place]/ [place] twice weekly testing.  Future Appointments  Date Time Provider [place]5/21/2019  9:15 AM [facility] NURSE [facility]  08/16/2017 10:15 AM BhJulianne HandlerCNM [facility]  1:15 PM [place]

## 2017-08-09 NOTE — Progress Notes (Deleted)
Error

## 2017-08-09 NOTE — Progress Notes (Deleted)
   PRENATAL VISIT NOTE  Subjective:  Caroline Williamson is a 32 y.o. G3P2002 at [redacted]w[redacted]d being seen today for ongoing prenatal care.  She is currently monitored for the following issues for this {Blank single:19197::"high-risk","low-risk"} pregnancy and has Supervision of high risk pregnancy, antepartum, third trimester; Rubella non-immune status, antepartum; Asthma exacerbation; Gestational diabetes mellitus in pregnancy, insulin controlled; and Fetal macrocephaly affecting antepartum care of mother on their problem list.  Patient reports {sx:[id number]}.  Contractions: Not present. Vag. Bleeding: None.  Movement: Absent. Denies leaking of fluid.   The following portions of the patient's history were reviewed and updated as appropriate: allergies, current medications, past family history, past medical history, past social history, past surgical history and problem list. Problem list updated.  Objective:   Vitals:   08/09/17 0843  BP: 108/74  Pulse: (!) 107  Weight: 177 lb 12.8 oz (80.6 kg)    Fetal Status: Fetal Heart Rate (bpm): NST-R Fundal Height: 37 cm Movement: Absent  Presentation: Vertex  General:  Alert, oriented and cooperative. Patient is in no acute distress.  Skin: Skin is warm and dry. No rash noted.   Cardiovascular: Normal heart rate noted  Respiratory: Normal respiratory effort, no problems with respiration noted  Abdomen: Soft, gravid, appropriate for gestational age.  Pain/Pressure: Present     Pelvic: {Blank single:19197::"Cervical exam performed","Cervical exam deferred"} Dilation: Fingertip Effacement (%): 0 Station: Ballotable  Extremities: Normal range of motion.  Edema: Trace  Mental Status: Normal mood and affect. Normal behavior. Normal judgment and thought content.   Assessment and Plan:  Pregnancy: G3P2002 at [redacted]w[redacted]d  1. Encounter for supervision of normal first pregnancy in third trimester *** - GC/Chlamydia probe amp ([place])not at [facility] - Culture, beta strep  (group b only)  2. White classification A2 gestational diabetes mellitus (GDM), insulin controlled *** - POCT Glucose (CBG) - [place] MFM OB FOLLOW UP; Future  3. Insulin controlled gestational diabetes mellitus (GDM) in third trimester *** - [place] MFM OB FOLLOW UP; Future  4. Rubella non-immune status, antepartum ***  5. Supervision of high risk pregnancy, antepartum, third trimester *** - [place] MFM OB FOLLOW UP; Future  6. Fetal macrocephaly affecting antepartum care of mother, single or unspecified fetus *** - [place] MFM OB FOLLOW UP; Future  7. Encounter for other antenatal screening follow-up *** - [place] MFM OB FOLLOW UP; Future  {Blank single:19197::"Term","Preterm"} labor symptoms and general obstetric precautions including but not limited to vaginal bleeding, contractions, leaking of fluid and fetal movement were reviewed in detail with the patient. Please refer to After Visit Summary for other counseling recommendations.  Return in about 1 week (around 08/16/2017) for ROB w/ [place] or [place]. Continue twice weekly testing.  No future appointments.  Dorathy Kinsman, CNM

## 2017-08-12 LAB — CULTURE, BETA STREP (GROUP B ONLY)
MICRO NUMBER:: 90603594
SPECIMEN QUALITY:: ADEQUATE

## 2017-08-12 LAB — GC/CHLAMYDIA PROBE AMP (~~LOC~~) NOT AT ARMC
CHLAMYDIA, DNA PROBE: NEGATIVE
Neisseria Gonorrhea: NEGATIVE

## 2017-08-13 ENCOUNTER — Ambulatory Visit (INDEPENDENT_AMBULATORY_CARE_PROVIDER_SITE_OTHER): Payer: Medicaid Other | Admitting: *Deleted

## 2017-08-13 DIAGNOSIS — O24414 Gestational diabetes mellitus in pregnancy, insulin controlled: Secondary | ICD-10-CM | POA: Diagnosis not present

## 2017-08-16 ENCOUNTER — Ambulatory Visit (INDEPENDENT_AMBULATORY_CARE_PROVIDER_SITE_OTHER): Payer: Medicaid Other | Admitting: Certified Nurse Midwife

## 2017-08-16 ENCOUNTER — Encounter: Payer: Self-pay | Admitting: Certified Nurse Midwife

## 2017-08-16 VITALS — BP 132/81 | HR 107

## 2017-08-16 DIAGNOSIS — O24414 Gestational diabetes mellitus in pregnancy, insulin controlled: Secondary | ICD-10-CM

## 2017-08-16 DIAGNOSIS — O0993 Supervision of high risk pregnancy, unspecified, third trimester: Secondary | ICD-10-CM

## 2017-08-16 NOTE — Progress Notes (Signed)
Pt co headaches

## 2017-08-16 NOTE — Progress Notes (Signed)
Subjective:  Ghina Bittinger is a 32 y.o. G3P2002 at [redacted]w[redacted]d being seen today for ongoing prenatal care.  She is currently monitored for the following issues for this high-risk pregnancy and has Supervision of high risk pregnancy, antepartum, third trimester; Rubella non-immune status, antepartum; Asthma exacerbation; Gestational diabetes mellitus in pregnancy, insulin controlled; and Fetal macrosomia affecting management of mother, antepartum on their problem list.  Patient reports no complaints.  Contractions: Not present. Vag. Bleeding: None.  Movement: Absent. Denies leaking of fluid.   The following portions of the patient's history were reviewed and updated as appropriate: allergies, current medications, past family history, past medical history, past social history, past surgical history and problem list. Problem list updated.  Objective:   Vitals:   08/16/17 1111  BP: 132/81  Pulse: (!) 107    Fetal Status: Fetal Heart Rate (bpm): NST-R   Movement: Absent  Presentation: Vertex  General:  Alert, oriented and cooperative. Patient is in no acute distress.  Skin: Skin is warm and dry. No rash noted.   Cardiovascular: Normal heart rate noted  Respiratory: Normal respiratory effort, no problems with respiration noted  Abdomen: Soft, gravid, appropriate for gestational age. Pain/Pressure: Present     Pelvic: Vag. Bleeding: None Vag D/C Character: Thin   Cervical exam performed Dilation: Fingertip Effacement (%): 40 Station: -3  Extremities: Normal range of motion.  Edema: Trace  Mental Status: Normal mood and affect. Normal behavior. Normal judgment and thought content.   Urinalysis:      Assessment and Plan:  Pregnancy: G3P2002 at [redacted]w[redacted]d  1. Supervision of high risk pregnancy, antepartum, third trimester  2. Insulin controlled gestational diabetes mellitus (GDM) in third trimester - taking NPH 30/11 and Reg 12/11 - FBS low 90s, postprandials 100-115 - NST reactive, AFI 12.2cm - MFM  [place] next Friday - NST on Tuesday - LGA, pelvis proven to 8'9 - IOL scheduled for 08/27/17- may need outpt foley  Term labor symptoms and general obstetric precautions including but not limited to vaginal bleeding, contractions, leaking of fluid and fetal movement were reviewed in detail with the patient. Please refer to After Visit Summary for other counseling recommendations.  Return in about 1 week (around 08/23/2017).   Donette Larry, CNM

## 2017-08-20 ENCOUNTER — Encounter (HOSPITAL_COMMUNITY): Payer: Self-pay | Admitting: *Deleted

## 2017-08-20 ENCOUNTER — Telehealth (HOSPITAL_COMMUNITY): Payer: Self-pay | Admitting: *Deleted

## 2017-08-20 ENCOUNTER — Ambulatory Visit (INDEPENDENT_AMBULATORY_CARE_PROVIDER_SITE_OTHER): Payer: Medicaid Other | Admitting: Family Medicine

## 2017-08-20 VITALS — BP 129/75 | HR 110 | Wt 181.0 lb

## 2017-08-20 DIAGNOSIS — O24414 Gestational diabetes mellitus in pregnancy, insulin controlled: Secondary | ICD-10-CM

## 2017-08-20 DIAGNOSIS — O0993 Supervision of high risk pregnancy, unspecified, third trimester: Secondary | ICD-10-CM

## 2017-08-20 NOTE — Telephone Encounter (Signed)
Preadmission screen  

## 2017-08-20 NOTE — Progress Notes (Signed)
   PRENATAL VISIT NOTE  Subjective:  Caroline Williamson is a 32 y.o. G3P2002 at [redacted]w[redacted]d being seen today for ongoing prenatal care.  She is currently monitored for the following issues for this high-risk pregnancy and has Supervision of high risk pregnancy, antepartum, third trimester; Rubella non-immune status, antepartum; Asthma exacerbation; Gestational diabetes mellitus in pregnancy, insulin controlled; and Fetal macrosomia affecting management of mother, antepartum on their problem list.  Patient reports no complaints.  Contractions: Not present. Vag. Bleeding: None.  Movement: Present. Denies leaking of fluid.   The following portions of the patient's history were reviewed and updated as appropriate: allergies, current medications, past family history, past medical history, past social history, past surgical history and problem list. Problem list updated.  Objective:   Vitals:   08/20/17 1530  BP: 129/75  Pulse: (!) 110  Weight: 181 lb (82.1 kg)    Fetal Status:     Movement: Present     General:  Alert, oriented and cooperative. Patient is in no acute distress.  Skin: Skin is warm and dry. No rash noted.   Cardiovascular: Normal heart rate noted  Respiratory: Normal respiratory effort, no problems with respiration noted  Abdomen: Soft, gravid, appropriate for gestational age.  Pain/Pressure: Present     Pelvic: Cervical exam deferred        Extremities: Normal range of motion.  Edema: Trace  Mental Status: Normal mood and affect. Normal behavior. Normal judgment and thought content.  NST:  Baseline: 140 bpm, Variability: Good {> 6 bpm), Accelerations: Reactive and Decelerations: Absent   Assessment and Plan:  Pregnancy: G3P2002 at [redacted]w[redacted]d  1. Supervision of high risk pregnancy, antepartum, third trimester   2. Insulin controlled gestational diabetes mellitus (GDM) in third trimester Reports good glycemic control. Has had easy labors with 2 prior pregnancies of 8.5 lbs with eas  2nd stage < 3 wks since last u/s. Does not need repeat. Advised of risks of SVD and low threshold if stalls for C-section. Risks of shoulder dystocia reviewed.  Term labor symptoms and general obstetric precautions including but not limited to vaginal bleeding, contractions, leaking of fluid and fetal movement were reviewed in detail with the patient. Please refer to After Visit Summary for other counseling recommendations.  Return in 1 week (on 08/27/2017) for ob visit, NST and foley insertion.  Future Appointments  Date Time Provider Department Center  08/23/2017  9:15 AM [facility] NURSE [facility]  08/26/2017  8:30 AM Donette Larry, CNM [facility]  08/27/2017  7:30 AM WH-BSSCHED ROOM WH-BSS[facility]ED None    Reva Bores, MD

## 2017-08-20 NOTE — Patient Instructions (Signed)

## 2017-08-23 ENCOUNTER — Ambulatory Visit (INDEPENDENT_AMBULATORY_CARE_PROVIDER_SITE_OTHER): Payer: Medicaid Other | Admitting: *Deleted

## 2017-08-23 ENCOUNTER — Ambulatory Visit (HOSPITAL_COMMUNITY): Payer: Medicaid Other

## 2017-08-23 DIAGNOSIS — O24414 Gestational diabetes mellitus in pregnancy, insulin controlled: Secondary | ICD-10-CM | POA: Diagnosis not present

## 2017-08-26 ENCOUNTER — Inpatient Hospital Stay (EMERGENCY_DEPARTMENT_HOSPITAL)
Admission: AD | Admit: 2017-08-26 | Discharge: 2017-08-26 | Disposition: A | Discharge status: Home / Self Care | Payer: Medicaid Other | Source: Ambulatory Visit | Attending: Obstetrics & Gynecology | Admitting: Obstetrics & Gynecology

## 2017-08-26 ENCOUNTER — Encounter (HOSPITAL_COMMUNITY): Payer: Self-pay

## 2017-08-26 ENCOUNTER — Ambulatory Visit (INDEPENDENT_AMBULATORY_CARE_PROVIDER_SITE_OTHER): Payer: Medicaid Other | Admitting: Certified Nurse Midwife

## 2017-08-26 VITALS — BP 121/75 | HR 113 | Wt 183.0 lb

## 2017-08-26 DIAGNOSIS — O24414 Gestational diabetes mellitus in pregnancy, insulin controlled: Secondary | ICD-10-CM | POA: Diagnosis not present

## 2017-08-26 DIAGNOSIS — O3660X0 Maternal care for excessive fetal growth, unspecified trimester, not applicable or unspecified: Secondary | ICD-10-CM

## 2017-08-26 DIAGNOSIS — Z3689 Encounter for other specified antenatal screening: Secondary | ICD-10-CM

## 2017-08-26 DIAGNOSIS — O0993 Supervision of high risk pregnancy, unspecified, third trimester: Secondary | ICD-10-CM

## 2017-08-26 DIAGNOSIS — Z3A39 39 weeks gestation of pregnancy: Secondary | ICD-10-CM

## 2017-08-26 NOTE — MAU Note (Signed)
Pt had foley bulb placed this morning in clinic, pt has noted that when she gets up from lying down blood is leaking from the end of the catheter.  Continues to have cramping, has had light bleeding, no LOF.  Some decreased FM.

## 2017-08-26 NOTE — Discharge Instructions (Signed)
Fetal Movement Counts  Patient Name: ________________________________________________ Patient Due Date: ____________________  What is a fetal movement count?  A fetal movement count is the number of times that you feel your baby move during a certain amount of time. This may also be called a fetal kick count. A fetal movement count is recommended for every pregnant woman. You may be asked to start counting fetal movements as early as week 28 of your pregnancy.  Pay attention to when your baby is most active. You may notice your baby's sleep and wake cycles. You may also notice things that make your baby move more. You should do a fetal movement count:  · When your baby is normally most active.  · At the same time each day.    A good time to count movements is while you are resting, after having something to eat and drink.  How do I count fetal movements?  1. Find a quiet, comfortable area. Sit, or lie down on your side.  2. Write down the date, the start time and stop time, and the number of movements that you felt between those two times. Take this information with you to your health care visits.  3. For 2 hours, count kicks, flutters, swishes, rolls, and jabs. You should feel at least 10 movements during 2 hours.  4. You may stop counting after you have felt 10 movements.  5. If you do not feel 10 movements in 2 hours, have something to eat and drink. Then, keep resting and counting for 1 hour. If you feel at least 4 movements during that hour, you may stop counting.  Contact a health care provider if:  · You feel fewer than 4 movements in 2 hours.  · Your baby is not moving like he or she usually does.  Date: ____________ Start time: ____________ Stop time: ____________ Movements: ____________  Date: ____________ Start time: ____________ Stop time: ____________ Movements: ____________  Date: ____________ Start time: ____________ Stop time: ____________ Movements: ____________   Date: ____________ Start time: ____________ Stop time: ____________ Movements: ____________  Date: ____________ Start time: ____________ Stop time: ____________ Movements: ____________  Date: ____________ Start time: ____________ Stop time: ____________ Movements: ____________  Date: ____________ Start time: ____________ Stop time: ____________ Movements: ____________  Date: ____________ Start time: ____________ Stop time: ____________ Movements: ____________  Date: ____________ Start time: ____________ Stop time: ____________ Movements: ____________  This information is not intended to replace advice given to you by your health care provider. Make sure you discuss any questions you have with your health care provider.  Document Released: 04/11/2006 Document Revised: 11/09/2015 Document Reviewed: 04/21/2015  Elsevier Interactive Patient Education © 2018 [employer]    Labor Induction  Labor induction is when steps are taken to cause a pregnant woman to begin the labor process. Most women go into labor on their own between 37 weeks and 42 weeks of the pregnancy. When this does not happen or when there is a medical need, methods may be used to induce labor. Labor induction causes a pregnant woman's uterus to contract. It also causes the cervix to soften (ripen), open (dilate), and thin out (efface). Usually, labor is not induced before 39 weeks of the pregnancy unless there is a problem with the baby or mother.  Before inducing labor, your health care provider will consider a number of factors, including the following:  · The medical condition of you and the baby.  · How many weeks along you are.  ·   The status of the baby’s lung maturity.  · The condition of the cervix.  · The position of the baby.    What are the reasons for labor induction?  Labor may be induced for the following reasons:  · The health of the baby or mother is at risk.  · The pregnancy is overdue by 1 week or more.   · The water breaks but labor does not start on its own.  · The mother has a health condition or serious illness, such as high blood pressure, infection, placental abruption, or diabetes.  · The amniotic fluid amounts are low around the baby.  · The baby is distressed.    Convenience or wanting the baby to be born on a certain date is not a reason for inducing labor.  What methods are used for labor induction?  Several methods of labor induction may be used, such as:  · Prostaglandin medicine. This medicine causes the cervix to dilate and ripen. The medicine will also start contractions. It can be taken by mouth or by inserting a suppository into the vagina.  · Inserting a thin tube (catheter) with a balloon on the end into the vagina to dilate the cervix. Once inserted, the balloon is expanded with water, which causes the cervix to open.  · Stripping the membranes. Your health care provider separates amniotic sac tissue from the cervix, causing the cervix to be stretched and causing the release of a hormone called progesterone. This may cause the uterus to contract. It is often done during an office visit. You will be sent home to wait for the contractions to begin. You will then come in for an induction.  · Breaking the water. Your health care provider makes a hole in the amniotic sac using a small instrument. Once the amniotic sac breaks, contractions should begin. This may still take hours to see an effect.  · Medicine to trigger or strengthen contractions. This medicine is given through an IV access tube inserted into a vein in your arm.    All of the methods of induction, besides stripping the membranes, will be done in the hospital. Induction is done in the hospital so that you and the baby can be carefully monitored.  How long does it take for labor to be induced?  Some inductions can take up to 2–3 days. Depending on the cervix, it usually takes less time. It takes longer when you are induced early in the  pregnancy or if this is your first pregnancy. If a mother is still pregnant and the induction has been going on for 2–3 days, either the mother will be sent home or a cesarean delivery will be needed.  What are the risks associated with labor induction?  Some of the risks of induction include:  · Changes in fetal heart rate, such as too high, too low, or erratic.  · Fetal distress.  · Chance of infection for the mother and baby.  · Increased chance of having a cesarean delivery.  · Breaking off (abruption) of the placenta from the uterus (rare).  · Uterine rupture (very rare).    When induction is needed for medical reasons, the benefits of induction may outweigh the risks.  What are some reasons for not inducing labor?  Labor induction should not be done if:  · It is shown that your baby does not tolerate labor.  · You have had previous surgeries on your uterus, such as a myomectomy or the removal   of fibroids.  · Your placenta lies very low in the uterus and blocks the opening of the cervix (placenta previa).  · Your baby is not in a head-down position.  · The umbilical cord drops down into the birth canal in front of the baby. This could cut off the baby's blood and oxygen supply.  · You have had a previous cesarean delivery.  · There are unusual circumstances, such as the baby being extremely premature.    This information is not intended to replace advice given to you by your health care provider. Make sure you discuss any questions you have with your health care provider.  Document Released: 08/01/2006 Document Revised: 08/18/2015 Document Reviewed: 10/09/2012  Elsevier Interactive Patient Education © 2017 [employer] Inc[employer]

## 2017-08-26 NOTE — MAU Provider Note (Signed)
Patient Caroline Williamson is a 32 y.o. G3P2002 At 42w1dhere with concerns about bleeding today after her FB insertion. She also  History     CSN: [id number] Arrival date and time: 08/26/17 1541   None     Chief Complaint  Patient presents with  . bleeding from foley bulb  . Decreased Fetal Movement   HPI  OB History    Gravida  3   Para  2   Term  2   Preterm      AB      Living  2     SAB      TAB      Ectopic      Multiple  0   Live Births              Past Medical History:  Diagnosis Date  . Asthma   . Diabetes mellitus without complication ([place]   . Gestational diabetes    insulin    Past Surgical History:  Procedure Laterality Date  . COLONOSCOPY    . UPPER GI ENDOSCOPY      Family History  Problem Relation Age of Onset  . Hypertension Maternal Grandmother   . Hypertension Maternal Grandfather     Social History   Tobacco Use  . Smoking status: Never Smoker  . Smokeless tobacco: Never Used  Substance Use Topics  . Alcohol use: No    Frequency: Never  . Drug use: No    Allergies: No Known Allergies  Medications Prior to Admission  Medication Sig Dispense Refill Last Dose  . insulin NPH Human (HUMULIN N,NOVOLIN N) 100 UNIT/ML injection 30 units in the morning and 11 units at bedtime (Patient taking differently: Inject 11-30 Units into the skin 2 (two) times daily. 30 units in the morning and 11 units at bedtime) 10 mL 3 08/26/2017 at 0630  . insulin regular (NOVOLIN R,HUMULIN R) 100 units/mL injection Take 16 units in the morning with breakfast and 11 units with supper (evening meal) (Patient taking differently: Inject 11-12 Units into the skin 2 (two) times daily before a meal. Take 12 units in the morning with breakfast and 11 units with supper (evening meal)) 10 mL 11 08/26/2017 at 0630  . Prenatal Vit-Fe Fumarate-FA (PRENATAL MULTIVITAMIN) TABS tablet Take 1 tablet by mouth daily at 12 noon.   08/26/2017 at Unknown time  . ACCU-CHEK  FASTCLIX LANCETS MISC 1 Units by Percutaneous route 4 (four) times daily. 100 each 12 Taking  . albuterol (PROVENTIL HFA;VENTOLIN HFA) 108 (90 Base) MCG/ACT inhaler Inhale 1-2 puffs into the lungs every 6 (six) hours as needed for wheezing or shortness of breath. 1 Inhaler 2 Rescue  . Blood Glucose Monitoring Suppl (ACCU-CHEK NANO SMARTVIEW) w/Device KIT 1 kit by Subdermal route as directed. Check blood sugars for fasting, and two hours after breakfast, lunch and dinner (4 checks daily) 1 kit 0 Taking  . glucose blood test strip Accu-chek glucose strips to be used QID for CBG checks. 50 each 12 Taking  . Insulin Syringes, Disposable, U-100 0.5 ML MISC 1 Syringe by Does not apply route 3 (three) times daily. 1 each 12 Taking    Review of Systems  Constitutional: Negative.   HENT: Negative.   Respiratory: Negative.   Cardiovascular: Negative.   Gastrointestinal: Positive for abdominal pain. Negative for diarrhea, nausea and vomiting.  Genitourinary: Positive for vaginal bleeding.  Musculoskeletal: Negative.   Neurological: Negative.   Psychiatric/Behavioral: Negative.    Physical Exam  Blood pressure (!) 125/56, pulse (!) 106, resp. rate 16, height _0  (1.651 m), last menstrual period 11/18/2016.  Physical Exam  Constitutional: She appears well-developed.  HENT:  Head: Normocephalic.  Neck: Normal range of motion.  Respiratory: Effort normal.  GI: Soft.  Genitourinary:  Genitourinary Comments: Normal external female genitalia; cervical exam deferred as Foley Bulb is in place.   Musculoskeletal: Normal range of motion.  Neurological: She is alert.  Skin: Skin is warm and dry.    MAU Course  Procedures  MDM -NST: 140, mod var, present acel, occasional variables; uterine irritability. Patient feels strong fetal movement while in MAU.  -Patient feels relieved to know that small amount of bleeding is normal, replaced catheter on the end of the foley tube so patient should not  experience any more leaking from her catheter.   Assessment and Plan   1. NST (non-stress test) reactive   2. [redacted] weeks gestation of pregnancy    2. Patient stable for discharge with pregnancy warning signs reviewed and when to return to MAU; otherwise, patient will come back tomorrow at 830AM for induction.   3. All questions answered.   Mervyn Skeeters [name] 08/26/2017, 4:33 PM

## 2017-08-26 NOTE — Progress Notes (Signed)
   PRENATAL VISIT NOTE  Subjective:  Caroline Williamson is a 32 y.o. G3P2002 at 8443w1d being seen today for ongoing prenatal care.  She is currently monitored for the following issues for this high-risk pregnancy and has Supervision of high risk pregnancy, antepartum, third trimester; Rubella non-immune status, antepartum; Asthma exacerbation; Gestational diabetes mellitus in pregnancy, insulin controlled; and Fetal macrosomia affecting management of mother, antepartum on their problem list.  Patient reports some vomiting last night.  Contractions: Not present. Vag. Bleeding: None.  Movement: Present. Denies leaking of fluid.   The following portions of the patient's history were reviewed and updated as appropriate: allergies, current medications, past family history, past medical history, past social history, past surgical history and problem list. Problem list updated.  Objective:   Vitals:   08/26/17 0822  BP: 121/75  Pulse: (!) 113  Weight: 183 lb (83 kg)    Fetal Status: Fetal Heart Rate (bpm): NST-R   Movement: Present  Presentation: Vertex  General:  Alert, oriented and cooperative. Patient is in no acute distress.  Skin: Skin is warm and dry. No rash noted.   Cardiovascular:   Respiratory: Normal respiratory effort, no problems with respiration noted  Abdomen: Soft, gravid, appropriate for gestational age.  Pain/Pressure: Present     Pelvic: Cervical exam performed Dilation: 1.5 Effacement (%): 50 Station: -3  Extremities: Normal range of motion.  Edema: Mild pitting, slight indentation  Mental Status:  Normal mood and affect. Normal behavior. Normal judgment and thought content.   Procedure: Patient informed of R/B/A of procedure. NST was performed and was reactive prior to procedure. Procedure done to begin ripening of the cervix prior to admission for induction of labor. Appropriate time out taken. The patient was placed in the lithotomy position and a cervical exam was  performed and a finger was used to guide the 31F foley balloon through the internal os of the cervix. Foley Balloon filled with 60cc of normal saline. Plug inserted into end of the foley. Foley placed on tension and taped to medical thigh. NST post procedure:  Baseline: 140 bpm, Variability: Good {> 6 bpm), Accelerations: Reactive and Decelerations: Absent there were no signs of tachysystole or hypertonus. All equipment was removed and accounted for. The patient tolerated the procedure well.   Assessment and Plan:  Pregnancy: G3P2002 at 643w1d 1. Insulin controlled gestational diabetes mellitus (GDM) in third trimester -FBS running in 70s, post breakfast have been low in the 50s over last few days  2. Fetal macrosomia affecting management of mother, antepartum - pelvis proven to 8'9 - risk of SD discussed at previous visit  3. Supervision of high risk pregnancy, antepartum, third trimester - IOL scheduled for 0830 tomorrow  S/p Outpatient placement of foley balloon catheter for cervical ripening. Induction of labor scheduled for tomorrow at 0830 am. Reassuring FHR tracing with no concerns at present. Warning signs given to patient to include return to MAU for heavy vaginal bleeding, rupture of membranes, painful uterine contractions q 5 mins or less, severe abdominal discomfort, decreased fetal movement.  [facility] 1 [facility] (around 08/27/2017). [facility]   Haniah Penny, CNM 08/26/2017 9:50 AM

## 2017-08-26 NOTE — Patient Instructions (Signed)
Braxton Hicks Contractions °Contractions of the uterus can occur throughout pregnancy, but they are not always a sign that you are in labor. You may have practice contractions called Braxton Hicks contractions. These false labor contractions are sometimes confused with true labor. °What are Braxton Hicks contractions? °Braxton Hicks contractions are tightening movements that occur in the muscles of the uterus before labor. Unlike true labor contractions, these contractions do not result in opening (dilation) and thinning of the cervix. Toward the end of pregnancy (32-34 weeks), Braxton Hicks contractions can happen more often and may become stronger. These contractions are sometimes difficult to tell apart from true labor because they can be very uncomfortable. You should not feel embarrassed if you go to the hospital with false labor. °Sometimes, the only way to tell if you are in true labor is for your health care provider to look for changes in the cervix. The health care provider will do a physical exam and may monitor your contractions. If you are not in true labor, the exam should show that your cervix is not dilating and your water has not broken. °If there are other health problems associated with your pregnancy, it is completely safe for you to be sent home with false labor. You may continue to have Braxton Hicks contractions until you go into true labor. °How to tell the difference between true labor and false labor °True labor °· Contractions last 30-70 seconds. °· Contractions become very regular. °· Discomfort is usually felt in the top of the uterus, and it spreads to the lower abdomen and low back. °· Contractions do not go away with walking. °· Contractions usually become more intense and increase in frequency. °· The cervix dilates and gets thinner. °False labor °· Contractions are usually shorter and not as strong as true labor contractions. °· Contractions are usually irregular. °· Contractions  are often felt in the front of the lower abdomen and in the groin. °· Contractions may go away when you walk around or change positions while lying down. °· Contractions get weaker and are shorter-lasting as time goes on. °· The cervix usually does not dilate or become thin. °Follow these instructions at home: °· Take over-the-counter and prescription medicines only as told by your health care provider. °· Keep up with your usual exercises and follow other instructions from your health care provider. °· Eat and drink lightly if you think you are going into labor. °· If Braxton Hicks contractions are making you uncomfortable: °? Change your position from lying down or resting to walking, or change from walking to resting. °? Sit and rest in a tub of warm water. °? Drink enough fluid to keep your urine pale yellow. Dehydration may cause these contractions. °? Do slow and deep breathing several times an hour. °· Keep all follow-up prenatal visits as told by your health care provider. This is important. °Contact a health care provider if: °· You have a fever. °· You have continuous pain in your abdomen. °Get help right away if: °· Your contractions become stronger, more regular, and closer together. °· You have fluid leaking or gushing from your vagina. °· You pass blood-tinged mucus (bloody show). °· You have bleeding from your vagina. °· You have low back pain that you never had before. °· You feel your baby’s head pushing down and causing pelvic pressure. °· Your baby is not moving inside you as much as it used to. °Summary °· Contractions that occur before labor are called Braxton   Hicks contractions, false labor, or practice contractions. °· Braxton Hicks contractions are usually shorter, weaker, farther apart, and less regular than true labor contractions. True labor contractions usually become progressively stronger and regular and they become more frequent. °· Manage discomfort from Braxton Hicks contractions by  changing position, resting in a warm bath, drinking plenty of water, or practicing deep breathing. °This information is not intended to replace advice given to you by your health care provider. Make sure you discuss any questions you have with your health care provider. °Document Released: 07/26/2016 Document Revised: 07/26/2016 Document Reviewed: 07/26/2016 °Elsevier Interactive Patient Education © 2018 [employer] ° °

## 2017-08-26 NOTE — Progress Notes (Signed)
Pt has been throwing up during the night

## 2017-08-27 ENCOUNTER — Inpatient Hospital Stay (HOSPITAL_COMMUNITY)
Admission: RE | Admit: 2017-08-27 | Discharge: 2017-08-29 | DRG: 806 | Disposition: A | Discharge status: Home / Self Care | Payer: Medicaid Other | Attending: Family Medicine | Admitting: Family Medicine

## 2017-08-27 ENCOUNTER — Inpatient Hospital Stay (HOSPITAL_COMMUNITY): Payer: Medicaid Other | Admitting: Anesthesiology

## 2017-08-27 ENCOUNTER — Encounter (HOSPITAL_COMMUNITY): Payer: Self-pay

## 2017-08-27 DIAGNOSIS — Z3A38 38 weeks gestation of pregnancy: Secondary | ICD-10-CM

## 2017-08-27 DIAGNOSIS — D649 Anemia, unspecified: Secondary | ICD-10-CM | POA: Diagnosis present

## 2017-08-27 DIAGNOSIS — Z3A39 39 weeks gestation of pregnancy: Secondary | ICD-10-CM

## 2017-08-27 DIAGNOSIS — Z283 Underimmunization status: Secondary | ICD-10-CM

## 2017-08-27 DIAGNOSIS — O9952 Diseases of the respiratory system complicating childbirth: Secondary | ICD-10-CM | POA: Diagnosis present

## 2017-08-27 DIAGNOSIS — O3663X0 Maternal care for excessive fetal growth, third trimester, not applicable or unspecified: Secondary | ICD-10-CM | POA: Diagnosis present

## 2017-08-27 DIAGNOSIS — O9902 Anemia complicating childbirth: Secondary | ICD-10-CM | POA: Diagnosis present

## 2017-08-27 DIAGNOSIS — O9989 Other specified diseases and conditions complicating pregnancy, childbirth and the puerperium: Secondary | ICD-10-CM

## 2017-08-27 DIAGNOSIS — J45901 Unspecified asthma with (acute) exacerbation: Secondary | ICD-10-CM | POA: Diagnosis present

## 2017-08-27 DIAGNOSIS — O0993 Supervision of high risk pregnancy, unspecified, third trimester: Secondary | ICD-10-CM

## 2017-08-27 DIAGNOSIS — O24414 Gestational diabetes mellitus in pregnancy, insulin controlled: Secondary | ICD-10-CM

## 2017-08-27 DIAGNOSIS — Z2839 Other underimmunization status: Secondary | ICD-10-CM

## 2017-08-27 DIAGNOSIS — Z8759 Personal history of other complications of pregnancy, childbirth and the puerperium: Secondary | ICD-10-CM | POA: Diagnosis present

## 2017-08-27 DIAGNOSIS — O24424 Gestational diabetes mellitus in childbirth, insulin controlled: Secondary | ICD-10-CM | POA: Diagnosis present

## 2017-08-27 DIAGNOSIS — O09899 Supervision of other high risk pregnancies, unspecified trimester: Secondary | ICD-10-CM

## 2017-08-27 DIAGNOSIS — O3660X0 Maternal care for excessive fetal growth, unspecified trimester, not applicable or unspecified: Secondary | ICD-10-CM

## 2017-08-27 LAB — CBC
HEMATOCRIT: 26.2 % — AB (ref 36.0–46.0)
HEMOGLOBIN: 8.1 g/dL — AB (ref 12.0–15.0)
MCH: 21.6 pg — ABNORMAL LOW (ref 26.0–34.0)
MCHC: 30.9 g/dL (ref 30.0–36.0)
MCV: 69.9 fL — AB (ref 78.0–100.0)
Platelets: 349 10*3/uL (ref 150–400)
RBC: 3.75 MIL/uL — ABNORMAL LOW (ref 3.87–5.11)
RDW: 18.1 % — AB (ref 11.5–15.5)
WBC: 9.6 10*3/uL (ref 4.0–10.5)

## 2017-08-27 LAB — GLUCOSE, CAPILLARY
GLUCOSE-CAPILLARY: 122 mg/dL — AB (ref 65–99)
GLUCOSE-CAPILLARY: 65 mg/dL (ref 65–99)
GLUCOSE-CAPILLARY: 69 mg/dL (ref 65–99)
GLUCOSE-CAPILLARY: 79 mg/dL (ref 65–99)
GLUCOSE-CAPILLARY: 60 mg/dL — AB (ref 65–99)
Glucose-Capillary: 47 mg/dL — ABNORMAL LOW (ref 65–99)
Glucose-Capillary: 57 mg/dL — ABNORMAL LOW (ref 65–99)
Glucose-Capillary: 55 mg/dL — ABNORMAL LOW (ref 65–99)
Glucose-Capillary: 94 mg/dL (ref 65–99)
Glucose-Capillary: 51 mg/dL — ABNORMAL LOW (ref 65–99)
Glucose-Capillary: 53 mg/dL — ABNORMAL LOW (ref 65–99)
Glucose-Capillary: 60 mg/dL — ABNORMAL LOW (ref 65–99)
Glucose-Capillary: 95 mg/dL (ref 65–99)

## 2017-08-27 LAB — TYPE AND SCREEN
ABO/RH(D): O POS
ANTIBODY SCREEN: NEGATIVE

## 2017-08-27 LAB — ABO/RH: ABO/RH(D): O POS

## 2017-08-27 LAB — RPR: RPR: NONREACTIVE

## 2017-08-27 MED ORDER — ACETAMINOPHEN 325 MG PO TABS: 650.0000 mg | ORAL_TABLET | ORAL | Status: DC | PRN

## 2017-08-27 MED ORDER — LIDOCAINE HCL (PF) 1 % IJ SOLN
30.0000 mL | INTRAMUSCULAR | Status: DC | PRN
  Filled 2017-08-27: qty 30

## 2017-08-27 MED ORDER — OXYCODONE-ACETAMINOPHEN 5-325 MG PO TABS: 1.0000 | ORAL_TABLET | ORAL | Status: DC | PRN

## 2017-08-27 MED ORDER — FENTANYL CITRATE (PF) 100 MCG/2ML IJ SOLN: 100.0000 ug | INTRAMUSCULAR | Status: DC | PRN

## 2017-08-27 MED ORDER — EPHEDRINE 5 MG/ML INJ
10.0000 mg | INTRAVENOUS | Status: DC | PRN
  Filled 2017-08-27: qty 2

## 2017-08-27 MED ORDER — LACTATED RINGERS IV SOLN: 500.0000 mL | INTRAVENOUS | Status: DC | PRN

## 2017-08-27 MED ORDER — DIPHENHYDRAMINE HCL 25 MG PO CAPS
25.0000 mg | ORAL_CAPSULE | Freq: Four times a day (QID) | ORAL | Status: DC | PRN
Start: 2017-08-27 — End: 2017-08-29

## 2017-08-27 MED ORDER — WITCH HAZEL-GLYCERIN EX PADS: 1.0000 "application " | MEDICATED_PAD | CUTANEOUS | Status: DC | PRN

## 2017-08-27 MED ORDER — DIBUCAINE 1 % RE OINT: 1.0000 "application " | TOPICAL_OINTMENT | RECTAL | Status: DC | PRN

## 2017-08-27 MED ORDER — TETANUS-DIPHTH-ACELL PERTUSSIS 5-2.5-18.5 LF-MCG/0.5 IM SUSP: 0.5000 mL | Freq: Once | INTRAMUSCULAR | Status: DC

## 2017-08-27 MED ORDER — OXYTOCIN BOLUS FROM INFUSION
500.0000 mL | Freq: Once | INTRAVENOUS | Status: AC
  Administered 2017-08-27: 500 mL via INTRAVENOUS

## 2017-08-27 MED ORDER — SENNOSIDES-DOCUSATE SODIUM 8.6-50 MG PO TABS
2.0000 | ORAL_TABLET | ORAL | Status: DC
  Filled 2017-08-27: qty 2

## 2017-08-27 MED ORDER — PHENYLEPHRINE 40 MCG/ML (10ML) SYRINGE FOR IV PUSH (FOR BLOOD PRESSURE SUPPORT): 80.0000 ug | PREFILLED_SYRINGE | INTRAVENOUS | Status: DC | PRN

## 2017-08-27 MED ORDER — MISOPROSTOL 25 MCG QUARTER TABLET
25.0000 ug | ORAL_TABLET | ORAL | Status: DC | PRN
  Filled 2017-08-27: qty 1

## 2017-08-27 MED ORDER — LACTATED RINGERS IV SOLN: 500.0000 mL | Freq: Once | INTRAVENOUS | Status: DC

## 2017-08-27 MED ORDER — PHENYLEPHRINE 40 MCG/ML (10ML) SYRINGE FOR IV PUSH (FOR BLOOD PRESSURE SUPPORT)
80.0000 ug | PREFILLED_SYRINGE | INTRAVENOUS | Status: DC | PRN
  Filled 2017-08-27: qty 10
  Filled 2017-08-27: qty 5

## 2017-08-27 MED ORDER — ACETAMINOPHEN 325 MG PO TABS
650.0000 mg | ORAL_TABLET | ORAL | Status: DC | PRN
  Administered 2017-08-28: 650 mg via ORAL
  Filled 2017-08-27: qty 2

## 2017-08-27 MED ORDER — BENZOCAINE-MENTHOL 20-0.5 % EX AERO
1.0000 "application " | INHALATION_SPRAY | CUTANEOUS | Status: DC | PRN
  Administered 2017-08-28: 1 via TOPICAL
  Filled 2017-08-27: qty 56

## 2017-08-27 MED ORDER — SIMETHICONE 80 MG PO CHEW: 80.0000 mg | CHEWABLE_TABLET | ORAL | Status: DC | PRN

## 2017-08-27 MED ORDER — ZOLPIDEM TARTRATE 5 MG PO TABS: 5.0000 mg | ORAL_TABLET | Freq: Every evening | ORAL | Status: DC | PRN

## 2017-08-27 MED ORDER — PHENYLEPHRINE 40 MCG/ML (10ML) SYRINGE FOR IV PUSH (FOR BLOOD PRESSURE SUPPORT)
80.0000 ug | PREFILLED_SYRINGE | INTRAVENOUS | Status: DC | PRN
  Filled 2017-08-27: qty 5

## 2017-08-27 MED ORDER — ONDANSETRON HCL 4 MG/2ML IJ SOLN: 4.0000 mg | INTRAMUSCULAR | Status: DC | PRN

## 2017-08-27 MED ORDER — OXYTOCIN 40 UNITS IN LACTATED RINGERS INFUSION - SIMPLE MED
2.5000 [IU]/h | INTRAVENOUS | Status: DC
  Filled 2017-08-27: qty 1000

## 2017-08-27 MED ORDER — DIPHENHYDRAMINE HCL 50 MG/ML IJ SOLN: 12.5000 mg | INTRAMUSCULAR | Status: DC | PRN

## 2017-08-27 MED ORDER — COCONUT OIL OIL: 1.0000 "application " | TOPICAL_OIL | Status: DC | PRN

## 2017-08-27 MED ORDER — SOD CITRATE-CITRIC ACID 500-334 MG/5ML PO SOLN: 30.0000 mL | ORAL | Status: DC | PRN

## 2017-08-27 MED ORDER — DEXTROSE IN LACTATED RINGERS 5 % IV SOLN
INTRAVENOUS | Status: DC
  Administered 2017-08-27: 19:00:00 via INTRAVENOUS

## 2017-08-27 MED ORDER — TERBUTALINE SULFATE 1 MG/ML IJ SOLN
0.2500 mg | Freq: Once | INTRAMUSCULAR | Status: DC | PRN
  Filled 2017-08-27: qty 1

## 2017-08-27 MED ORDER — ONDANSETRON HCL 4 MG/2ML IJ SOLN
4.0000 mg | Freq: Four times a day (QID) | INTRAMUSCULAR | Status: DC | PRN
  Administered 2017-08-27: 4 mg via INTRAVENOUS
  Filled 2017-08-27: qty 2

## 2017-08-27 MED ORDER — PRENATAL MULTIVITAMIN CH
1.0000 | ORAL_TABLET | Freq: Every day | ORAL | Status: DC
  Administered 2017-08-28 – 2017-08-29 (×2): 1 via ORAL
  Filled 2017-08-27 (×2): qty 1

## 2017-08-27 MED ORDER — LIDOCAINE HCL (PF) 1 % IJ SOLN
INTRAMUSCULAR | Status: DC | PRN
  Administered 2017-08-27: 6 mL via EPIDURAL

## 2017-08-27 MED ORDER — OXYTOCIN 40 UNITS IN LACTATED RINGERS INFUSION - SIMPLE MED
1.0000 m[IU]/min | INTRAVENOUS | Status: DC
  Administered 2017-08-27: 2 m[IU]/min via INTRAVENOUS

## 2017-08-27 MED ORDER — IBUPROFEN 600 MG PO TABS
600.0000 mg | ORAL_TABLET | Freq: Four times a day (QID) | ORAL | Status: DC
  Administered 2017-08-27 – 2017-08-29 (×6): 600 mg via ORAL
  Filled 2017-08-27 (×7): qty 1

## 2017-08-27 MED ORDER — OXYCODONE-ACETAMINOPHEN 5-325 MG PO TABS: 2.0000 | ORAL_TABLET | ORAL | Status: DC | PRN

## 2017-08-27 MED ORDER — LACTATED RINGERS IV SOLN
INTRAVENOUS | Status: DC
  Administered 2017-08-27: 08:00:00 via INTRAVENOUS

## 2017-08-27 MED ORDER — FENTANYL 2.5 MCG/ML BUPIVACAINE 1/10 % EPIDURAL INFUSION (WH - ANES)
14.0000 mL/h | INTRAMUSCULAR | Status: DC | PRN
  Administered 2017-08-27 (×2): 14 mL/h via EPIDURAL
  Filled 2017-08-27 (×2): qty 100

## 2017-08-27 MED ORDER — EPHEDRINE 5 MG/ML INJ: 10.0000 mg | INTRAVENOUS | Status: DC | PRN

## 2017-08-27 MED ORDER — ONDANSETRON HCL 4 MG PO TABS: 4.0000 mg | ORAL_TABLET | ORAL | Status: DC | PRN

## 2017-08-27 NOTE — Anesthesia Preprocedure Evaluation (Addendum)
Anesthesia Evaluation  Patient identified by MRN, date of birth, ID band Patient awake    Reviewed: Allergy & Precautions, NPO status , Patient's Chart, lab work & pertinent test results  Airway Mallampati: I  TM Distance: >3 FB Neck ROM: Full    Dental no notable dental hx. (+) Teeth Intact   Pulmonary asthma ,    Pulmonary exam normal breath sounds clear to auscultation       Cardiovascular Exercise Tolerance: Good negative cardio ROS Normal cardiovascular exam Rhythm:Regular Rate:Normal     Neuro/Psych negative neurological ROS  negative psych ROS   GI/Hepatic negative GI ROS, Neg liver ROS,   Endo/Other  diabetes, Gestational  Renal/GU negative Renal ROS     Musculoskeletal   Abdominal   Peds  Hematology  (+) anemia ,   Anesthesia Other Findings   Reproductive/Obstetrics (+) Pregnancy                            Lab Results  Component Value Date   WBC 9.6 08/27/2017   HGB 8.1 (L) 08/27/2017   HCT 26.2 (L) 08/27/2017   MCV 69.9 (L) 08/27/2017   PLT 349 08/27/2017    Anesthesia Physical Anesthesia Plan  ASA: III  Anesthesia Plan: Epidural   Post-op Pain Management:    Induction:   PONV Risk Score and Plan:   Airway Management Planned:   Additional Equipment:   Intra-op Plan:   Post-operative Plan:   Informed Consent: I have reviewed the patients History and Physical, chart, labs and discussed the procedure including the risks, benefits and alternatives for the proposed anesthesia with the patient or authorized representative who has indicated his/her understanding and acceptance.     Plan Discussed with:   Anesthesia Plan Comments:         Anesthesia Quick Evaluation

## 2017-08-27 NOTE — Progress Notes (Signed)
Epidural removed at 2213

## 2017-08-27 NOTE — Progress Notes (Signed)
Pt came in for morning IOL stated has foley bulb placed yesterday in office to start IOL. Pt states it fell out last night.

## 2017-08-27 NOTE — Anesthesia Pain Management Evaluation Note (Signed)
  CRNA Pain Management Visit Note  Patient: Caroline Williamson, 32 y.o., female  "Hello I am a [facility] of the anesthesia [facility] at [facility]. We have an anesthesia team available at all times to provide care throughout the hospital, including epidural management and anesthesia for C-section. I don't know your plan for the delivery whether it a natural birth, water birth, IV sedation, nitrous supplementation, doula or epidural, but we want to meet your pain goals."   1.Was your pain managed to your expectations on prior hospitalizations?   Yes   2.What is your expectation for pain management during this hospitalization?     Epidural  3.How can we help you reach that goal?   Record the patient's initial score and the patient's pain goal.   Pain: 0  Pain Goal: 6 [facility] wants you to be able to say your pain was always managed very well.  Laban EmperorMalinova,Lamara Brecht Hristova 08/27/2017

## 2017-08-27 NOTE — Progress Notes (Signed)
Hypoglycemic Event  CBG: 53  Treatment: 15 GM carbohydrate snack  Symptoms: None  Follow-up CBG: Time:1750 CBG Result:60  Possible Reasons for Event: Inadequate meal intake  Comments/MD notified:Dr. Degele notified and is okay with pt CBG of 60    Caroline StaggersMargaret C Antavious Williamson

## 2017-08-27 NOTE — Plan of Care (Signed)
Hypoglycemic Event  CBG: 55  Treatment: 15 GM carbohydrate snack  Symptoms: None  Follow-up CBG: Time:1318 CBG Result:69  Possible Reasons for Event: Unknown  Comments/MD notified:Degele    Laurel Harnden, Betha LoaJennifer Burgess

## 2017-08-27 NOTE — Plan of Care (Signed)
Hypoglycemic Event  CBG: 57  Treatment: 15 GM carbohydrate snack  Symptoms: None  Follow-up CBG: Time:1105 CBG Result:65  Possible Reasons for Event: Medication regimen: had insulin this am and Unknown  Comments/MD notified:Degele    Caroline Williamson, Caroline Williamson

## 2017-08-27 NOTE — Plan of Care (Signed)
Hypoglycemic Event  CBG: 47  Treatment: 15 GM carbohydrate snack  Symptoms: None  Follow-up CBG: Time:1049 CBG Result 57  Possible Reasons for Event: Medication regimen: had insulin this am  Comments/MD notified:Degele    Rudolph Dobler, Betha LoaJennifer Burgess

## 2017-08-27 NOTE — Plan of Care (Signed)
Hypoglycemic Event  CBG: 60  Treatment: 15 GM carbohydrate snack  Symptoms: None  Follow-up CBG: Time:1722 CBG Result:94  Possible Reasons for Event: Unknown  Comments/MD notified:Degele    Caroline Williamson, Caroline Williamson

## 2017-08-27 NOTE — H&P (Signed)
LABOR AND DELIVERY ADMISSION HISTORY AND PHYSICAL NOTE  Caroline Williamson is a 32 y.o. female G38P2002 with IUP at 73w2dby early U/S presenting for IOL for A2GDM. She reports positive fetal movement. She denies leakage of fluid or vaginal bleeding.  [facility]  Patient Active Problem List   Diagnosis Date Noted  . GDM, class A2 08/27/2017  . Fetal macrosomia affecting management of mother, antepartum 08/09/2017  . Gestational diabetes mellitus in pregnancy, insulin controlled 07/19/2017  . Asthma exacerbation 03/29/2017  . Rubella non-immune status, antepartum 03/01/2017  . Supervision of high risk pregnancy, antepartum, third trimester 01/31/2017    Past Medical History: Past Medical History:  Diagnosis Date  . Asthma   . Diabetes mellitus without complication ([place]   . Gestational diabetes    insulin    Past Surgical History: Past Surgical History:  Procedure Laterality Date  . COLONOSCOPY    . UPPER GI ENDOSCOPY      Obstetrical History: OB History    Gravida  3   Para  2   Term  2   Preterm      AB      Living  2     SAB      TAB      Ectopic      Multiple  0   Live Births  2           Social History: Social History   Socioeconomic History  . Marital status: Married    Spouse name: Not on file  . Number of children: Not on file  . Years of education: Not on file  . Highest education level: Not on file  Occupational History  . Not on file  Social Needs  . Financial resource strain: Not on file  . Food insecurity:    Worry: Not on file    Inability: Not on file  . Transportation needs:    Medical: Not on file    Non-medical: Not on file  Tobacco Use  . Smoking status: Never Smoker  . Smokeless tobacco: Never Used  Substance and Sexual Activity  . Alcohol use: No    Frequency: Never  . Drug use: No  . Sexual activity: Yes    Birth control/protection: None  Lifestyle  .  Physical activity:    Days per week: Not on file    Minutes per session: Not on file  . Stress: Not on file  Relationships  . Social connections:    Talks on phone: Not on file    Gets together: Not on file    Attends religious service: Not on file    Active member of club or organization: Not on file    Attends meetings of clubs or organizations: Not on file    Relationship status: Not on file  Other Topics Concern  . Not on file  Social History Narrative  . Not on file    Family History: Family History  Problem Relation Age of Onset  . Hypertension Maternal Grandmother   . Hypertension Maternal Grandfather     Allergies: No Known Allergies  Medications Prior to Admission  Medication Sig Dispense Refill Last Dose  . ACCU-CHEK FASTCLIX LANCETS MISC 1 Units by Percutaneous route 4 (four) times daily. 100 each 12 Taking  . albuterol (PROVENTIL HFA;VENTOLIN HFA) 108 (90 Base) MCG/ACT inhaler Inhale 1-2 puffs into the lungs every 6 (six) hours as needed for wheezing or shortness of breath. 1 Inhaler  2 Rescue  . Blood Glucose Monitoring Suppl (ACCU-CHEK NANO SMARTVIEW) w/Device KIT 1 kit by Subdermal route as directed. Check blood sugars for fasting, and two hours after breakfast, lunch and dinner (4 checks daily) 1 kit 0 Taking  . glucose blood test strip Accu-chek glucose strips to be used QID for CBG checks. 50 each 12 Taking  . insulin NPH Human (HUMULIN N,NOVOLIN N) 100 UNIT/ML injection 30 units in the morning and 11 units at bedtime (Patient taking differently: Inject 11-30 Units into the skin 2 (two) times daily. 30 units in the morning and 11 units at bedtime) 10 mL 3 08/26/2017 at 0630  . insulin regular (NOVOLIN R,HUMULIN R) 100 units/mL injection Take 16 units in the morning with breakfast and 11 units with supper (evening meal) (Patient taking differently: Inject 11-12 Units into the skin 2 (two) times daily before a meal. Take 12 units in the morning with breakfast and 11  units with supper (evening meal)) 10 mL 11 08/26/2017 at 0630  . Insulin Syringes, Disposable, U-100 0.5 ML MISC 1 Syringe by Does not apply route 3 (three) times daily. 1 each 12 Taking  . Prenatal Vit-Fe Fumarate-FA (PRENATAL MULTIVITAMIN) TABS tablet Take 1 tablet by mouth daily at 12 noon.   08/26/2017 at Unknown time     Review of Systems  All systems reviewed and negative except as stated in HPI  Physical Exam Blood pressure 119/76, pulse 96, temperature 98.5 F (36.9 C), temperature source Oral, resp. rate 18, height '5\' 5"'$  (1.651 m), weight 81.6 kg (180 lb), last menstrual period 11/18/2016. General appearance: alert, oriented, NAD Lungs: normal respiratory effort Heart: regular rate Abdomen: soft, non-tender; gravid, FH appropriate for GA Extremities: No calf swelling or tenderness Presentation: cephalic by RN SVE Fetal monitoring: baseline 150, moderate variabiliy, +acel, occ variable Uterine activity: irregular, q3-4 min SVE: Dilation: 4 Effacement (%): 50 Station: -2 Exam by:: J.Cox, RN  Prenatal labs: ABO, Rh: --/--/O POS, O POS Performed at [facility], [address], [place], [place] [address]  [phone]) Antibody: NEG (06/04 3474) Rubella: <0.90 (11/08 1427) RPR: NON-REACTIVE (03/28 1500)  HBsAg: NON-REACTIVE (11/08 1427)  HIV: NON-REACTIVE (03/28 1500)  GC/Chlamydia: negative GBS: Negative (05/17 0000)  Glucola: 166 --> 3hr GTT: positive Genetic screening:  declined Anatomy US: normal female  Prenatal Transfer Tool  Maternal Diabetes: Yes:  Diabetes Type:  Insulin/Medication controlled Genetic Screening: Declined Maternal Ultrasounds/Referrals: Normal Fetal Ultrasounds or other Referrals:  None Maternal Substance Abuse:  No Significant Maternal Medications:  Meds include: Other: insulin Significant Maternal Lab Results: Lab values include: Group B Strep negative  Results for orders placed or performed during the hospital encounter of 08/27/17  (from the past 24 hour(s))  CBC   Collection Time: 08/27/17  8:21 AM  Result Value Ref Range   WBC 9.6 4.0 - 10.5 K/uL   RBC 3.75 (L) 3.87 - 5.11 MIL/uL   Hemoglobin 8.1 (L) 12.0 - 15.0 g/dL   HCT 26.2 (L) 36.0 - 46.0 %   MCV 69.9 (L) 78.0 - 100.0 fL   MCH 21.6 (L) 26.0 - 34.0 pg   MCHC 30.9 30.0 - 36.0 g/dL   RDW 18.1 (H) 11.5 - 15.5 %   Platelets 349 150 - 400 K/uL  Type and screen [place]   Collection Time: 08/27/17  8:21 AM  Result Value Ref Range   ABO/RH(D) O POS    Antibody Screen NEG    Sample Expiration  08/30/2017 Performed at [facility], [address], [place], [place] [address]   ABO/Rh   Collection Time: 08/27/17  8:21 AM  Result Value Ref Range   ABO/RH(D)      O POS Performed at [facility], [place], [place] [address]   Glucose, capillary   Collection Time: 08/27/17  8:24 AM  Result Value Ref Range   Glucose-Capillary 122 (H) 65 - 99 mg/dL    Patient Active Problem List   Diagnosis Date Noted  . GDM, class A2 08/27/2017  . Fetal macrosomia affecting management of mother, antepartum 08/09/2017  . Gestational diabetes mellitus in pregnancy, insulin controlled 07/19/2017  . Asthma exacerbation 03/29/2017  . Rubella non-immune status, antepartum 03/01/2017  . Supervision of high risk pregnancy, antepartum, third trimester 01/31/2017    Assessment: Caroline Williamson is a 32 y.o. G3P2002 at 73w2dhere for IOL for GDM (insulin-controlled)  #GDM: monitor CBG q2h. Start IV Insulin if needed. EFW 3736  gm, 8lb 4oz, >90% at 343w2dAC >95%; pelvis proven to 8lb9oz  #Labor: s/p FB placement in the office, now out. Start IV Pitocin #Pain: IV pain meds prn; planning on epidural #FWB: Cat II #ID:  GBS neg #MOF: Breast #MOC:Undecided #Circ:  N/a  JuJenne Paneegele 08/27/2017, 9:37 AM

## 2017-08-27 NOTE — Progress Notes (Signed)
LABOR PROGRESS NOTE  Caroline Williamson is a 32 y.o. G3P2002 at 2527w2d  admitted for IOL for GDM  Subjective: Patient feeling a lot of pressure  Objective: BP 111/65   Pulse (!) 168   Temp 98.8 F (37.1 C) (Oral)   Resp 18   Ht 5\' 5"  (1.651 m)   Wt 81.6 kg (180 lb)   LMP 11/18/2016   SpO2 90%   BMI 29.95 kg/m  or  Vitals:   08/27/17 1731 08/27/17 1801 08/27/17 1831 08/27/17 1900  BP: 121/69 125/86 105/64 111/65  Pulse: 95 (!) 117 (!) 152 (!) 168  Resp:    18  Temp:    98.8 F (37.1 C)  TempSrc:    Oral  SpO2:    90%  Weight:      Height:        SVE Dilation: 10 Dilation Complete Date: 08/27/17 Dilation Complete Time: 1839 Effacement (%): 90 Station: -1 Presentation: Vertex Exam by:: J.Cox, RN FHT: baseline rate 150, moderate varibility, +acel, variable decel Toco: ctx q 2 min  CBG (last 3)  Recent Labs    08/27/17 1703 08/27/17 1721 08/27/17 1913  GLUCAP 60* 94 51*    Assessment / Plan: 32 y.o. G3P2002 at 6727w2d here for IOL for GDM  Labor: progressing well. Now complete, will start pushing Fetal Wellbeing:  Cat II Pain Control:  epidural Anticipated MOD:  SVD  GDM: Pt took insulin this morning prior to admission; has had a few hypolgycemic events, asymptomatic. Has responded to po treatement, but CBG 51 again. Will treat again with 15 g of glucose po, but will also start D5-LR for maintenance.   Frederik PearJulie P Degele, MD 08/27/2017, 7:29 PM

## 2017-08-28 LAB — GLUCOSE, CAPILLARY: Glucose-Capillary: 85 mg/dL (ref 65–99)

## 2017-08-28 MED ORDER — FERROUS SULFATE 325 (65 FE) MG PO TABS
325.0000 mg | ORAL_TABLET | Freq: Two times a day (BID) | ORAL | Status: DC
  Administered 2017-08-28 – 2017-08-29 (×3): 325 mg via ORAL
  Filled 2017-08-28 (×3): qty 1

## 2017-08-28 NOTE — Anesthesia Postprocedure Evaluation (Signed)
Anesthesia Post Note  Patient: Caroline Williamson  Procedure(s) Performed: AN AD HOC LABOR EPIDURAL     Patient location during evaluation: Mother Baby Anesthesia Type: Epidural Level of consciousness: awake and alert Pain management: pain level controlled Vital Signs Assessment: post-procedure vital signs reviewed and stable Respiratory status: spontaneous breathing, nonlabored ventilation and respiratory function stable Cardiovascular status: stable Postop Assessment: no headache, no backache and epidural receding Anesthetic complications: no    Last Vitals:  Vitals:   08/27/17 2326 08/28/17 0333  BP: 127/69 (!) 111/59  Pulse: (!) 118 98  Resp: 16 16  Temp: 37.1 C 36.9 C  SpO2:      Last Pain:  Vitals:   08/28/17 0622  TempSrc:   PainSc: 0-No pain   Pain Goal:                 Junious SilkGILBERT,Jeron Grahn

## 2017-08-28 NOTE — Progress Notes (Signed)
POSTPARTUM PROGRESS NOTE  Post Partum Day 1 Subjective:  Blanchie ServeLauren Waner is a 32 y.o. [id number] 4174w2d s/p IOL for GDM.  No acute events overnight.  Pt denies problems with ambulating, voiding or po intake.  She denies nausea or vomiting.  Pain is well controlled.  She has had flatus. She has not had bowel movement.  Lochia Minimal. Pt denies CP, SOB, HA, dizziness.  Objective: Blood pressure (!) 111/59, pulse 98, temperature 98.4 F (36.9 C), temperature source Oral, resp. rate 16, height 5\' 5"  (1.651 m), weight 180 lb (81.6 kg), last menstrual period 11/18/2016, SpO2 99 %, unknown if currently breastfeeding.  Physical Exam:  General: alert, cooperative and no distress Lochia:normal flow Chest: CTAB Heart: RRR no m/r/g Abdomen: +BS, soft, nontender,  Uterine Fundus: firm, tender DVT Evaluation: No calf swelling or tenderness Extremities: minimal LE edema  Recent Labs    08/27/17 0821  HGB 8.1*  HCT 26.2*    Assessment/Plan:  ASSESSMENT: Blanchie ServeLauren Faust is a 32 y.o. [id number] 5774w2d s/p IOL for GDM.  Plan for discharge tomorrow Breastfeeding without issue GDM: most recent AM CBG 85 wnl Contraception : Unsure   LOS: 1 day   Cyndee Brightlyonnor  M Leean Amezcua, Medical Student 08/28/2017, 7:29 AM

## 2017-08-28 NOTE — Plan of Care (Signed)
Progressing appropriately. Encouraged to call for assistance as needed, and [facility]. Safety information, paperwork, and room orientation complete.

## 2017-08-28 NOTE — Progress Notes (Signed)
Post Partum Day 1 Subjective: Eating, drinking, voiding, ambulating well.  +flatus.  Lochia and pain wnl.  Denies dizziness, lightheadedness, or sob. No complaints.   Objective: Blood pressure (!) 111/59, pulse 98, temperature 98.4 F (36.9 C), temperature source Oral, resp. rate 16, height 5\' 5"  (1.651 m), weight 81.6 kg (180 lb), last menstrual period 11/18/2016, SpO2 99 %, unknown if currently breastfeeding.  Physical Exam:  General: alert, cooperative and no distress Lochia: appropriate Uterine Fundus: firm Incision: n/a DVT Evaluation: No evidence of DVT seen on physical exam. Negative Homan's sign. No cords or calf tenderness. No significant calf/ankle edema.  Recent Labs    08/27/17 0821  HGB 8.1*  HCT 26.2*   CBG (last 3)  Recent Labs    08/27/17 1948 08/27/17 2049 08/28/17 0623  GLUCAP 60* 95 85     Assessment/Plan: Plan for discharge tomorrow and Breastfeeding  Hgb on admit 8.1, asymptomatic, will start Fe BID AM CBG wnl   LOS: 1 day   Caroline Williamson 08/28/2017, 8:42 AM

## 2017-08-28 NOTE — Lactation Note (Signed)
This note was copied from a baby's chart. Lactation Consultation Note  Patient Name: Caroline Williamson ZOXWR'UToday's Date: 08/28/2017 Reason for consult: Initial assessment;Term;Maternal endocrine disorder Type of Endocrine Disorder?: Diabetes(Gestational)   Initial consult with mom of 17 hour old infant. Infant with 6 BF for 20-40 minutes, 2 voids and 5 stools since birth. LATCH scores 8-10. Mom reports feedings are going well. Mom denies pain with feeding. Infant currently getting bath.   Enc mom to feed infant STS 8-12 x in 24 hours at first feeding cues, discussed normalcy of cluster feeding.   [facility] and is aware to call then if they do not contact her in the hospital. Mom has Evenflo pump for home use. BF Resources handout and LC Brochure given, mom informed of IP/OP services and BF Support Groups. Mom reports she has no questions/concerns at this time. Mom to call out for feeding assistance as needed.    Maternal Data Formula Feeding for Exclusion: No Does the patient have breastfeeding experience prior to this delivery?: Yes  Feeding Feeding Type: Breast Fed Length of feed: 20 min  LATCH Score Latch: Grasps breast easily, tongue down, lips flanged, rhythmical sucking.  Audible Swallowing: A few with stimulation  Type of Nipple: Everted at rest and after stimulation  Comfort (Breast/Nipple): Soft / non-tender  Hold (Positioning): Assistance needed to correctly position infant at breast and maintain latch.  LATCH Score: 8  Interventions    Lactation Tools Discussed/Used WIC Program: Yes   Consult Status Consult Status: Follow-up Date: 08/29/17 Follow-up type: In-patient    Silas FloodSharon S Hice 08/28/2017, 2:29 PM

## 2017-08-29 MED ORDER — FERROUS SULFATE 325 (65 FE) MG PO TABS: 325.0000 mg | ORAL_TABLET | Freq: Two times a day (BID) | ORAL | 0 refills | Status: AC

## 2017-08-29 MED ORDER — MEASLES, MUMPS & RUBELLA VAC ~~LOC~~ INJ
0.5000 mL | INJECTION | Freq: Once | SUBCUTANEOUS | Status: DC
  Filled 2017-08-29: qty 0.5

## 2017-08-29 MED ORDER — IBUPROFEN 600 MG PO TABS: 600.0000 mg | ORAL_TABLET | Freq: Four times a day (QID) | ORAL | 0 refills | Status: AC

## 2017-08-29 NOTE — Lactation Note (Signed)
This note was copied from a baby's chart. Lactation Consultation Note; Experienced BF mom reports baby is nursing well. Has just finished nursing for 30 min and is asleep on mom's chest. Reports no pain with latch and that breasts are feeling fuller this morning. No questions at present To call prn  Patient Name: Caroline [name] WGNFA'OToday's Date: 08/29/2017 Reason for consult: Follow-up assessment   Maternal Data Formula Feeding for Exclusion: No Has patient been taught Hand Expression?: Yes Does the patient have breastfeeding experience prior to this delivery?: Yes  Feeding Feeding Type: Breast Fed Length of feed: 30 min  LATCH Score                   Interventions    Lactation Tools Discussed/Used     Consult Status Consult Status: Complete    Pamelia HoitWeeks, Cayleen Benjamin D 08/29/2017, 8:03 AM

## 2017-08-29 NOTE — Discharge Summary (Addendum)
OB Discharge Summary     Patient Name: Caroline Williamson DOB: [birth year] MRN: [id number]  Date of admission: 08/27/2017 Delivering MD: Josephine Igo B   Date of discharge: 08/29/2017  Admitting diagnosis: INDUCTION Intrauterine pregnancy: [redacted]w[redacted]d    Secondary diagnosis:  Active Problems:   Supervision of high risk pregnancy, antepartum, third trimester   Rubella non-immune status, antepartum   Fetal macrosomia affecting management of mother, antepartum   GDM, class A2 Anemia  Additional problems: none   Discharge diagnosis: Term Pregnancy Delivered, GDM A2 and Anemia                                                                                                Post partum procedures: recommended MMR prior to discharge  Augmentation: Pitocin and Foley Balloon  Complications: None  Hospital course:  Induction of Labor With Vaginal Delivery   32y.o. yo G3P3003 at 31w2das admitted to the hospital 08/27/2017 for induction of labor.  Indication for induction: A2 DM. Pt had a cervical foley placed as an outpt, then had Pit started upon arrival and progressed to SVD.  Patient had an uncomplicated labor course as follows: Membrane Rupture Time/Date: 4:07 PM ,08/27/2017   Intrapartum Procedures: Episiotomy: None [1]                                         Lacerations:  None [1]  Patient had delivery of a Viable infant.  Information for the patient's newborn:  GaArnetia, Bronk0[[id number]]Delivery Method: Vaginal, Spontaneous(Filed from Delivery Summary)   08/27/2017  Details of delivery can be found in separate delivery note.  Patient had a routine postpartum course. Her fasting CBG on PPD#1 was 85.  Patient is discharged home 08/29/17.  Physical exam  Vitals:   08/28/17 0333 08/28/17 0810 08/28/17 1821 08/29/17 0605  BP: (!) 111/59 117/67 123/71 120/66  Pulse: 98 70 100 (!) 102  Resp: _0 Temp: 98.4 F (36.9 C) 97.7 F (36.5 C) 98.1 F (36.7 C) 98 F (36.7 C)   TempSrc: Oral Oral Oral   SpO2:  97%    Weight:      Height:       General: alert and no distress Lochia: appropriate Uterine Fundus: firm Incision: N/A DVT Evaluation: No evidence of DVT seen on physical exam. Labs: Lab Results  Component Value Date   WBC 9.6 08/27/2017   HGB 8.1 (L) 08/27/2017   HCT 26.2 (L) 08/27/2017   MCV 69.9 (L) 08/27/2017   PLT 349 08/27/2017   CMP Latest Ref Rng & Units 06/25/2017  Glucose 65 - 104 mg/dL 111(H)    Discharge instruction: per After Visit Summary and "Baby and Me Booklet".  After visit meds:  Allergies as of 08/29/2017   No Known Allergies     Medication List    STOP taking these medications   ACCU-CHEK FASTCLIX LANCETS Misc   ACCU-CHEK NANO SMARTVIEW w/Device Kit   glucose blood test strip  insulin NPH Human 100 UNIT/ML injection Commonly known as:  HUMULIN N,NOVOLIN N   insulin regular 100 units/mL injection Commonly known as:  NOVOLIN R,HUMULIN R   Insulin Syringes (Disposable) U-100 0.5 ML Misc     TAKE these medications   albuterol 108 (90 Base) MCG/ACT inhaler Commonly known as:  PROVENTIL HFA;VENTOLIN HFA Inhale 1-2 puffs into the lungs every 6 (six) hours as needed for wheezing or shortness of breath.   ferrous sulfate 325 (65 FE) MG tablet Take 1 tablet (325 mg total) by mouth 2 (two) times daily with a meal.   ibuprofen 600 MG tablet Commonly known as:  ADVIL,MOTRIN Take 1 tablet (600 mg total) by mouth every 6 (six) hours.   prenatal multivitamin Tabs tablet Take 1 tablet by mouth daily at 12 noon.       Diet: routine diet  Activity: Advance as tolerated. Pelvic rest for 6 weeks.   Outpatient follow up:6 weeks with a 2h GTT Follow up Appt:No future appointments. Follow up Visit:No follow-ups on file.  Postpartum contraception: Undecided  Newborn Data: Live born female  Birth Weight: 9 lb 2.4 oz (4150 g) APGAR: 8, 9  Newborn Delivery   Birth date/time:  08/27/2017 20:32:00 Delivery type:   Vaginal, Spontaneous     Baby Feeding: Breast Disposition:home with mother   08/29/2017 Caroline Williamson, Student-PA  CNM attestation I have seen and examined this patient and agree with above documentation in the PA student's note.   Caroline Williamson is a 32 y.o. G3P3003 s/p SVD.   Pain is well controlled.  Plan for birth control is undecided.  Method of Feeding: breast  PE:  BP 120/66   Pulse (!) 102   Temp 98 F (36.7 C)   Resp 18   Ht 5' 5" (1.651 m)   Wt 81.6 kg (180 lb)   LMP 11/18/2016   SpO2 97%   Breastfeeding? Unknown   BMI 29.95 kg/m  Fundus firm  Recent Labs    08/27/17 0821  HGB 8.1*  HCT 26.2*     Plan: discharge today - postpartum care discussed - f/u clinic in 6 weeks for postpartum visit and a 2hr GTT   Serita Grammes, CNM 7:48 AM 08/29/2017

## 2017-08-29 NOTE — Discharge Instructions (Signed)
Vaginal Delivery, Care After °Refer to this sheet in the next few weeks. These instructions provide you with information about caring for yourself after vaginal delivery. Your health care provider may also give you more specific instructions. Your treatment has been planned according to current medical practices, but problems sometimes occur. Call your health care provider if you have any problems or questions. °What can I expect after the procedure? °After vaginal delivery, it is common to have: °· Some bleeding from your vagina. °· Soreness in your abdomen, your vagina, and the area of skin between your vaginal opening and your anus (perineum). °· Pelvic cramps. °· Fatigue. ° °Follow these instructions at home: °Medicines °· Take over-the-counter and prescription medicines only as told by your health care provider. °· If you were prescribed an antibiotic medicine, take it as told by your health care provider. Do not stop taking the antibiotic until it is finished. °Driving ° °· Do not drive or operate heavy machinery while taking prescription pain medicine. °· Do not drive for 24 hours if you received a sedative. °Lifestyle °· Do not drink alcohol. This is especially important if you are breastfeeding or taking medicine to relieve pain. °· Do not use tobacco products, including cigarettes, chewing tobacco, or e-cigarettes. If you need help quitting, ask your health care provider. °Eating and drinking °· Drink at least 8 eight-ounce glasses of water every day unless you are told not to by your health care provider. If you choose to breastfeed your baby, you may need to drink more water than this. °· Eat high-fiber foods every day. These foods may help prevent or relieve constipation. High-fiber foods include: °? Whole grain cereals and breads. °? Brown rice. °? Beans. °? Fresh fruits and vegetables. °Activity °· Return to your normal activities as told by your health care provider. Ask your health care provider  what activities are safe for you. °· Rest as much as possible. Try to rest or take a nap when your baby is sleeping. °· Do not lift anything that is heavier than your baby or 10 lb (4.5 kg) until your health care provider says that it is safe. °· Talk with your health care provider about when you can engage in sexual activity. This may depend on your: °? Risk of infection. °? Rate of healing. °? Comfort and desire to engage in sexual activity. °Vaginal Care °· If you have an episiotomy or a vaginal tear, check the area every day for signs of infection. Check for: °? More redness, swelling, or pain. °? More fluid or blood. °? Warmth. °? Pus or a bad smell. °· Do not use tampons or douches until your health care provider says this is safe. °· Watch for any blood clots that may pass from your vagina. These may look like clumps of dark red, brown, or black discharge. °General instructions °· Keep your perineum clean and dry as told by your health care provider. °· Wear loose, comfortable clothing. °· Wipe from front to back when you use the toilet. °· Ask your health care provider if you can shower or take a bath. If you had an episiotomy or a perineal tear during labor and delivery, your health care provider may tell you not to take baths for a certain length of time. °· Wear a bra that supports your breasts and fits you well. °· If possible, have someone help you with household activities and help care for your baby for at least a few days after   you leave the hospital. °· Keep all follow-up visits for you and your baby as told by your health care provider. This is important. °Contact a health care provider if: °· You have: °? Vaginal discharge that has a bad smell. °? Difficulty urinating. °? Pain when urinating. °? A sudden increase or decrease in the frequency of your bowel movements. °? More redness, swelling, or pain around your episiotomy or vaginal tear. °? More fluid or blood coming from your episiotomy or  vaginal tear. °? Pus or a bad smell coming from your episiotomy or vaginal tear. °? A fever. °? A rash. °? Little or no interest in activities you used to enjoy. °? Questions about caring for yourself or your baby. °· Your episiotomy or vaginal tear feels warm to the touch. °· Your episiotomy or vaginal tear is separating or does not appear to be healing. °· Your breasts are painful, hard, or turn red. °· You feel unusually sad or worried. °· You feel nauseous or you vomit. °· You pass large blood clots from your vagina. If you pass a blood clot from your vagina, save it to show to your health care provider. Do not flush blood clots down the toilet without having your health care provider look at them. °· You urinate more than usual. °· You are dizzy or light-headed. °· You have not breastfed at all and you have not had a menstrual period for 12 weeks after delivery. °· You have stopped breastfeeding and you have not had a menstrual period for 12 weeks after you stopped breastfeeding. °Get help right away if: °· You have: °? Pain that does not go away or does not get better with medicine. °? Chest pain. °? Difficulty breathing. °? Blurred vision or spots in your vision. °? Thoughts about hurting yourself or your baby. °· You develop pain in your abdomen or in one of your legs. °· You develop a severe headache. °· You faint. °· You bleed from your vagina so much that you fill two sanitary pads in one hour. °This information is not intended to replace advice given to you by your health care provider. Make sure you discuss any questions you have with your health care provider. °Document Released: 03/09/2000 Document Revised: 08/24/2015 Document Reviewed: 03/27/2015 °Elsevier Interactive Patient Education © 2018 [employer] ° °

## 2017-08-30 ENCOUNTER — Encounter (INDEPENDENT_AMBULATORY_CARE_PROVIDER_SITE_OTHER): Payer: Self-pay

## 2017-09-03 ENCOUNTER — Telehealth: Payer: Self-pay | Admitting: *Deleted

## 2017-09-03 NOTE — Telephone Encounter (Signed)
I have called the patient twice to schedule and sent a message in her MyChart. Her mailbox has not been set up to receive a voicemail message. Will continue to try weekly.

## 2017-09-03 NOTE — Telephone Encounter (Signed)
-----   Message from Kimberly R Booker, [place] sent at 08/27/2017  8:43 PM EDT ----- Regarding: PP Visit Please schedule this patient for PP visit in: 6 weeks High risk pregnancy complicated by: GDM Delivery mode:  SVD Anticipated Birth Control:  other/unsure PP Procedures needed: 2 hour GTT  Schedule Integrated BH visit: no Provider: Any provider

## 2017-10-07 ENCOUNTER — Telehealth: Payer: Self-pay | Admitting: *Deleted

## 2017-10-07 ENCOUNTER — Ambulatory Visit: Payer: Medicaid Other | Admitting: Advanced Practice Midwife

## 2017-10-07 NOTE — Telephone Encounter (Signed)
Called patient to reschedule 6 week GTT and Postpartum appointment. Could not leave a message due to patient's mailbox not being set up.

## 2017-10-17 ENCOUNTER — Telehealth: Payer: Self-pay | Admitting: *Deleted

## 2017-10-17 MED ORDER — DICLOXACILLIN SODIUM 250 MG PO CAPS: ORAL_CAPSULE | ORAL | 0 refills | Status: AC

## 2017-10-17 NOTE — Telephone Encounter (Signed)
Pt called stating that she thinks she has mastitis.  She had chills and temp of 102 with very painful breast.  She stated that she did not get a chance to pump last night at work.  RX for Docloxicillin sent to her pharmacy and encouraged pt to use warm compresses and try her best to pump on schedule.  Pt voices understanding.

## 2017-10-25 ENCOUNTER — Encounter: Payer: Self-pay | Admitting: Advanced Practice Midwife

## 2017-10-25 ENCOUNTER — Ambulatory Visit (INDEPENDENT_AMBULATORY_CARE_PROVIDER_SITE_OTHER): Payer: Medicaid Other | Admitting: Advanced Practice Midwife

## 2017-10-25 DIAGNOSIS — O0993 Supervision of high risk pregnancy, unspecified, third trimester: Secondary | ICD-10-CM

## 2017-10-25 DIAGNOSIS — Z1389 Encounter for screening for other disorder: Secondary | ICD-10-CM

## 2017-10-25 DIAGNOSIS — Z8632 Personal history of gestational diabetes: Secondary | ICD-10-CM

## 2017-10-25 DIAGNOSIS — Z8759 Personal history of other complications of pregnancy, childbirth and the puerperium: Secondary | ICD-10-CM

## 2017-10-25 NOTE — Progress Notes (Signed)
Post Partum Exam  Caroline Williamson is a 32 y.o. [id number] female who presents for a postpartum visit. She is 8 weeks postpartum following a spontaneous vaginal delivery. I have fully reviewed the prenatal and intrapartum course. The delivery was at 39 gestational week and one day.  Anesthesia: epidural. Postpartum course has been remarkable for Mastitis. All Sx resolved w/ ABX. Baby's course has been unremarkable. Baby is feeding by breast. Bleeding no bleeding. Bowel function is normal. Bladder function is normal. Patient is not sexually active. Contraception method is none (husband is deployed). Postpartum depression screening:neg  The following portions of the patient's history were reviewed and updated as appropriate: allergies, current medications, past family history, past medical history, past social history, past surgical history and problem list. Last pap smear done November 2018 and was Normal  Review of Systems Pertinent items are noted in HPI.    Objective:  Blood pressure 129/78, pulse 81, weight 157 lb (71.2 kg), last menstrual period 11/18/2016, unknown if currently breastfeeding.  General:  alert, cooperative, appears stated age and no distress   Breasts:  Declined exam  Lungs: clear to auscultation bilaterally  Heart:  regular rate and rhythm, S1, S2 normal, no murmur, click, rub or gallop  Abdomen: soft, non-tender; bowel sounds normal; no masses,  no organomegaly   Vulva:  not evaluated  Vagina: not evaluated                    Assessment:    Nml postpartum exam. Pap smear not done at today's visit.   Plan:   1. Contraception: abstinence. List given and discussed.  2. Pap due 2020 3. Follow up in: 1 year or as needed.

## 2017-10-28 NOTE — Patient Instructions (Signed)
Contraception Choices Contraception, also called birth control, refers to methods or devices that prevent pregnancy. Hormonal methods Contraceptive implant A contraceptive implant is a thin, plastic tube that contains a hormone. It is inserted into the upper part of the arm. It can remain in place for up to 3 years. Progestin-only injections Progestin-only injections are injections of progestin, a synthetic form of the hormone progesterone. They are given every 3 months by a health care provider. Birth control pills Birth control pills are pills that contain hormones that prevent pregnancy. They must be taken once a day, preferably at the same time each day. Birth control patch The birth control patch contains hormones that prevent pregnancy. It is placed on the skin and must be changed once a week for three weeks and removed on the fourth week. A prescription is needed to use this method of contraception. Vaginal ring A vaginal ring contains hormones that prevent pregnancy. It is placed in the vagina for three weeks and removed on the fourth week. After that, the process is repeated with a new ring. A prescription is needed to use this method of contraception. Emergency contraceptive Emergency contraceptives prevent pregnancy after unprotected sex. They come in pill form and can be taken up to 5 days after sex. They work best the sooner they are taken after having sex. Most emergency contraceptives are available without a prescription. This method should not be used as your only form of birth control. Barrier methods Female condom A female condom is a thin sheath that is worn over the penis during sex. Condoms keep sperm from going inside a woman's body. They can be used with a spermicide to increase their effectiveness. They should be disposed after a single use. Female condom A female condom is a soft, loose-fitting sheath that is put into the vagina before sex. The condom keeps sperm from going  inside a woman's body. They should be disposed after a single use. Diaphragm A diaphragm is a soft, dome-shaped barrier. It is inserted into the vagina before sex, along with a spermicide. The diaphragm blocks sperm from entering the uterus, and the spermicide kills sperm. A diaphragm should be left in the vagina for 6-8 hours after sex and removed within 24 hours. A diaphragm is prescribed and fitted by a health care provider. A diaphragm should be replaced every 1-2 years, after giving birth, after gaining more than 15 lb (6.8 kg), and after pelvic surgery. Cervical cap A cervical cap is a round, soft latex or plastic cup that fits over the cervix. It is inserted into the vagina before sex, along with spermicide. It blocks sperm from entering the uterus. The cap should be left in place for 6-8 hours after sex and removed within 48 hours. A cervical cap must be prescribed and fitted by a health care provider. It should be replaced every 2 years. Sponge A sponge is a soft, circular piece of polyurethane foam with spermicide on it. The sponge helps block sperm from entering the uterus, and the spermicide kills sperm. To use it, you make it wet and then insert it into the vagina. It should be inserted before sex, left in for at least 6 hours after sex, and removed and thrown away within 30 hours. Spermicides Spermicides are chemicals that kill or block sperm from entering the cervix and uterus. They can come as a cream, jelly, suppository, foam, or tablet. A spermicide should be inserted into the vagina with an applicator at least 10-15 minutes before   sex to allow time for it to work. The process must be repeated every time you have sex. Spermicides do not require a prescription. Intrauterine contraception Intrauterine device (IUD) An IUD is a T-shaped device that is put in a woman's uterus. There are two types:  Hormone IUD.This type contains progestin, a synthetic form of the hormone progesterone. This  type can stay in place for 3-5 years.  Copper IUD.This type is wrapped in copper wire. It can stay in place for 10 years.  Permanent methods of contraception Female tubal ligation In this method, a woman's fallopian tubes are sealed, tied, or blocked during surgery to prevent eggs from traveling to the uterus. Hysteroscopic sterilization In this method, a small, flexible insert is placed into each fallopian tube. The inserts cause scar tissue to form in the fallopian tubes and block them, so sperm cannot reach an egg. The procedure takes about 3 months to be effective. Another form of birth control must be used during those 3 months. Female sterilization This is a procedure to tie off the tubes that carry sperm (vasectomy). After the procedure, the man can still ejaculate fluid (semen). Natural planning methods Natural family planning In this method, a couple does not have sex on days when the woman could become pregnant. Calendar method This means keeping track of the length of each menstrual cycle, identifying the days when pregnancy can happen, and not having sex on those days. Ovulation method In this method, a couple avoids sex during ovulation. Symptothermal method This method involves not having sex during ovulation. The woman typically checks for ovulation by watching changes in her temperature and in the consistency of cervical mucus. Post-ovulation method In this method, a couple waits to have sex until after ovulation. Summary  Contraception, also called birth control, means methods or devices that prevent pregnancy.  Hormonal methods of contraception include implants, injections, pills, patches, vaginal rings, and emergency contraceptives.  Barrier methods of contraception can include female condoms, female condoms, diaphragms, cervical caps, sponges, and spermicides.  There are two types of IUDs (intrauterine devices). An IUD can be put in a woman's uterus to prevent pregnancy  for 3-5 years.  Permanent sterilization can be done through a procedure for males, females, or both.  Natural family planning methods involve not having sex on days when the woman could become pregnant. This information is not intended to replace advice given to you by your health care provider. Make sure you discuss any questions you have with your health care provider. Document Released: 03/12/2005 Document Revised: 04/14/2016 Document Reviewed: 04/14/2016 Elsevier Interactive Patient Education  2018 Elsevier Inc.  

## 2018-09-06 IMAGING — US US MFM FETAL BPP W/O NON-STRESS
1 series · 14 of 28 positions shown · non-contrast
Comparison: none

[Series 1: us mfm fetal bpp w/o non-stress · 90 acquisitions, 14 frames shown]
[im 4/90]
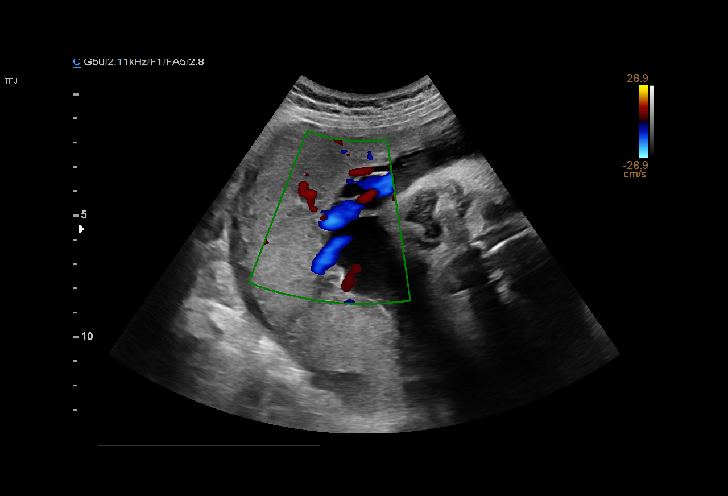
[im 10/90]
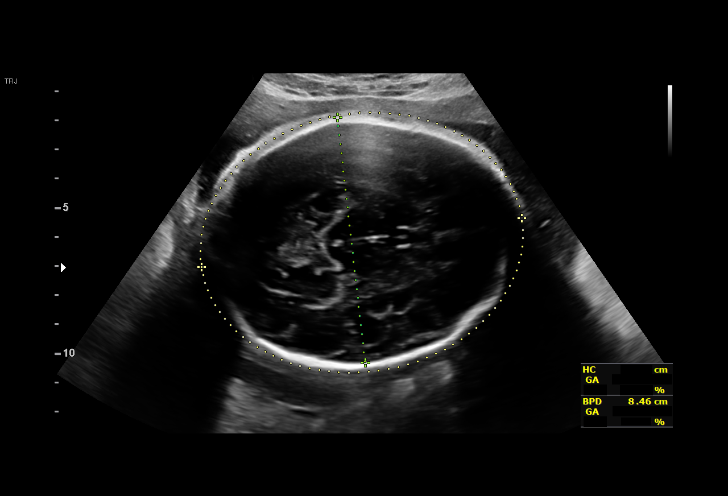
[im 17/90]
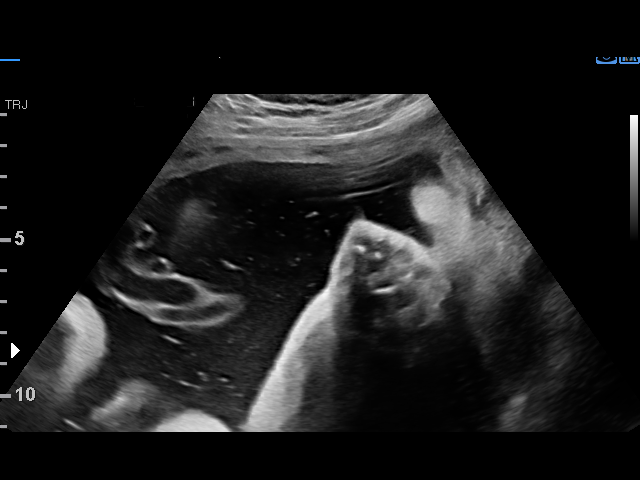
[im 24/90]
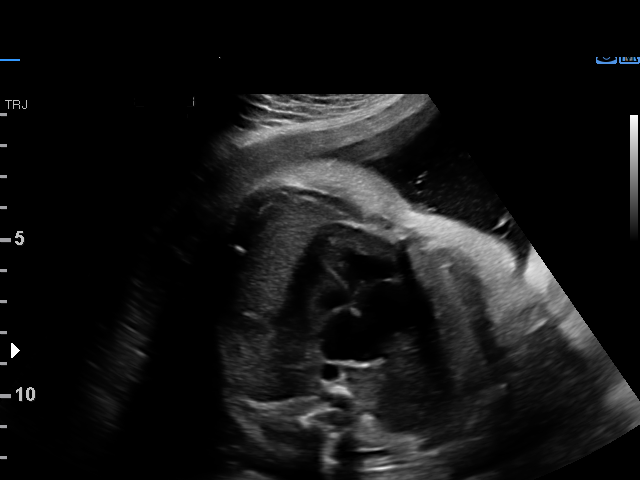
[im 30/90]
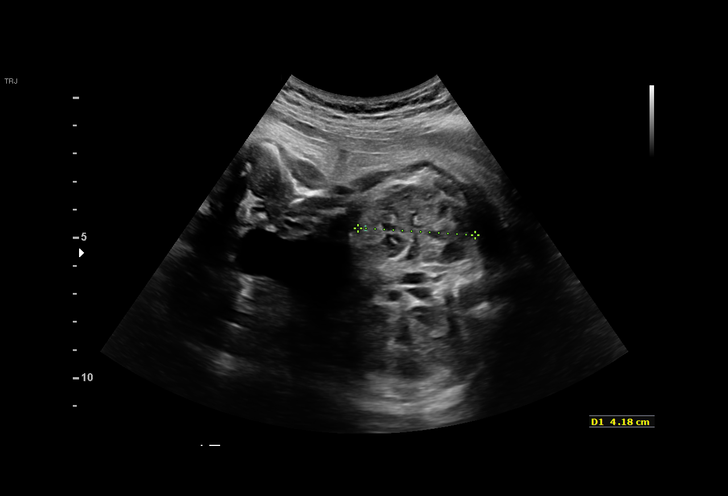
[im 37/90]
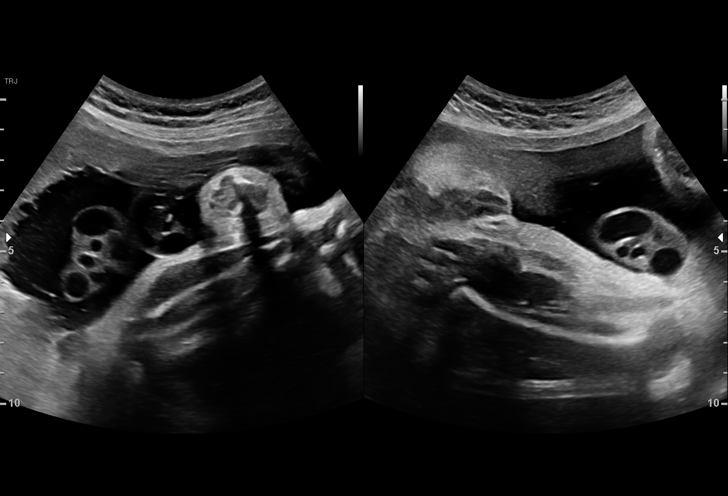
[im 43/90]
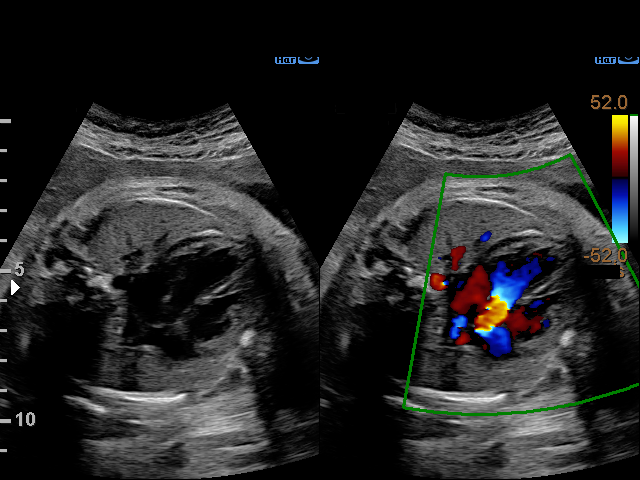
[im 50/90]
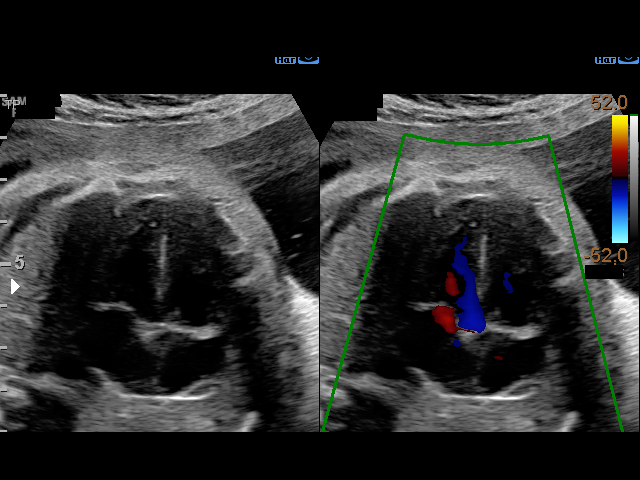
[im 57/90]
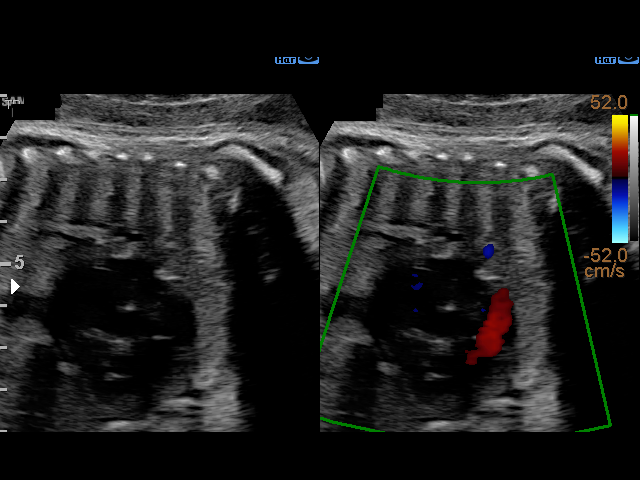
[im 63/90]
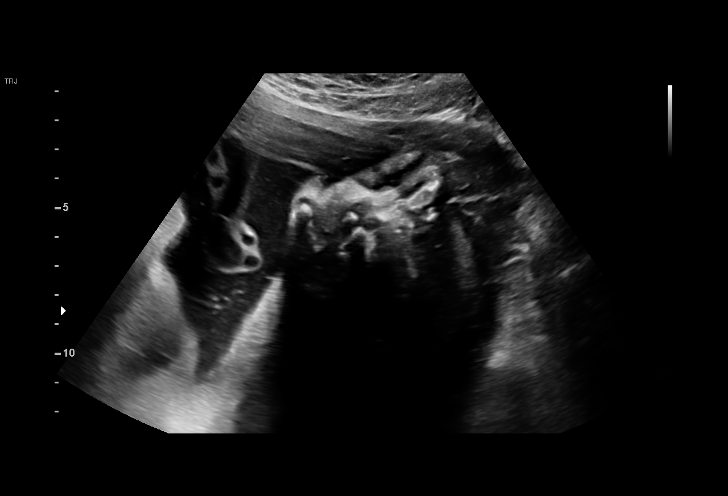
[im 70/90]
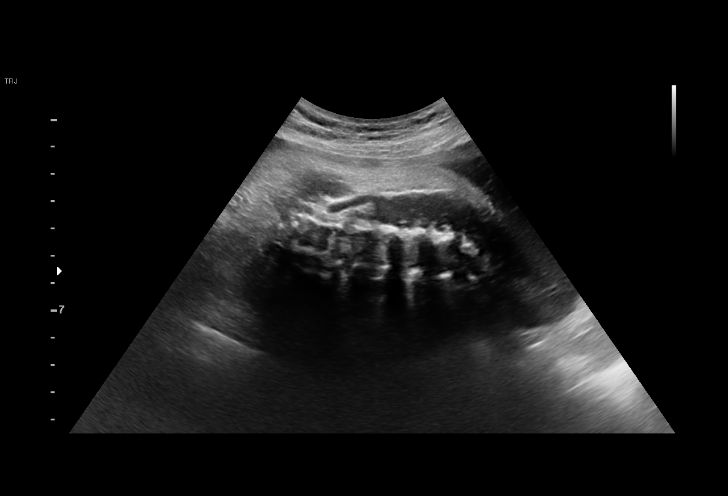
[im 76/90]
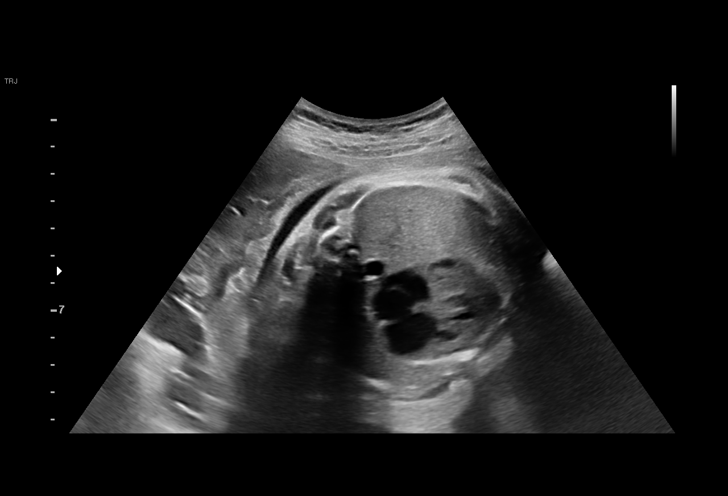
[im 83/90]
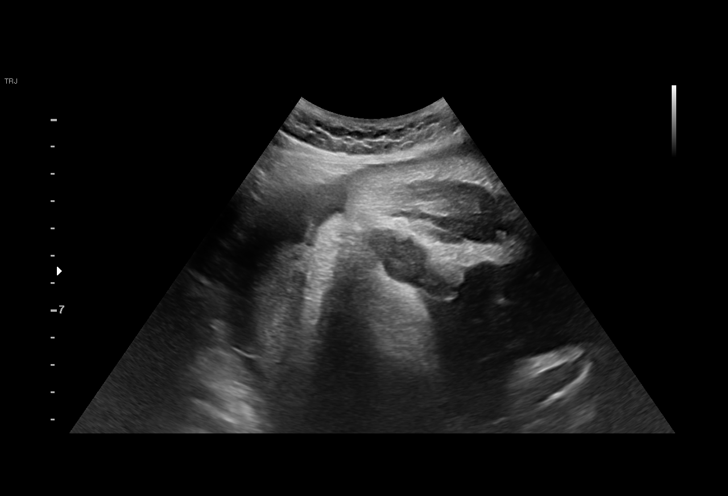
[im 90/90]
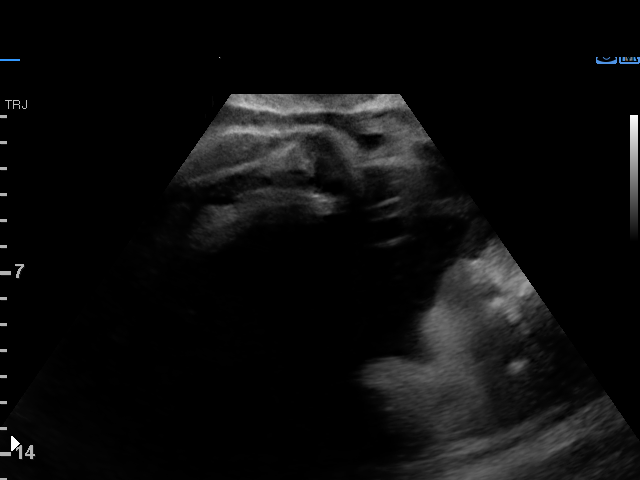

[14 of 28 positions shown; findings below may reference images not displayed]

1  BLAIN JUMPER                [id number]      [id number]     [id number]
2  BLAIN JUMPER                [id number]      [id number]     [id number]
Indications

33 weeks gestation of pregnancy
Encounter for antenatal screening for
malformations
Gestational diabetes in pregnancy, insulin
controlled
OB History

Gravidity:    3         Term:   2
Living:       2
Fetal Evaluation

Num Of Fetuses:     1
Fetal Heart         151
Rate(bpm):
Cardiac Activity:   Observed
Presentation:       Cephalic
Placenta:           Posterior Fundal, above cervical os
P. Cord Insertion:  Visualized

Amniotic Fluid
AFI FV:      Subjectively within normal limits

AFI Sum(cm)     %Tile       Largest Pocket(cm)
23.14           89

RUQ(cm)       RLQ(cm)       LUQ(cm)        LLQ(cm)
8.71
Biophysical Evaluation

Amniotic F.V:   Within normal limits       F. Tone:        Observed
F. Movement:    Observed                   Score:          [DATE]
F. Breathing:   Observed
Biometry

BPD:      84.6  mm     G. Age:  34w 0d         65  %    CI:        73.27   %    70 - 86
FL/HC:      19.7   %    19.9 -
HC:      314.1  mm     G. Age:  35w 1d         60  %    HC/AC:      0.95        0.96 -
AC:      329.6  mm     G. Age:  36w 6d       > 97  %    FL/BPD:     73.2   %    71 - 87
FL:       61.9  mm     G. Age:  32w 0d         11  %    FL/AC:      18.8   %    20 - 24
HUM:      53.6  mm     G. Age:  31w 1d         13  %
CER:      45.7  mm     G. Age:  N/A          > 95  %

Est. FW:    4889  gm    5 lb 12 oz      84  %
Gestational Age

LMP:           33w 3d        Date:  11/18/16                 EDD:   08/25/17
U/S Today:     34w 4d                                        EDD:   08/17/17
Best:          33w 3d     Det. By:  LMP  (11/18/16)          EDD:   08/25/17
Anatomy

Cranium:               Appears normal         Aortic Arch:            Appears normal
Cavum:                 Appears normal         Ductal Arch:            Appears normal
Ventricles:            Appears normal         Diaphragm:              Appears normal
Choroid Plexus:        Appears normal         Stomach:                Appears normal, left
sided
Cerebellum:            Appears normal         Abdomen:                Appears normal
Posterior Fossa:       Appears normal         Cord Vessels:           Appears normal (3
vessel cord)
Nuchal Fold:           Not applicable (>20    Kidneys:                Appear normal
wks GA)
Face:                  Appears normal         Bladder:                Appears normal
(orbits and profile)
Lips:                  Appears normal         Spine:                  Appears normal
Heart:                 Appears normal         Upper Extremities:      Visualized
(4CH, axis, and situs
RVOT:                  Appears normal         Lower Extremities:      Visualized
LVOT:                  Appears normal

Other:  Nasal bone. open hand. Female gender.
Myomas

Site                     L(cm)      W(cm)      D(cm)      Location
Rt anterior

Blood Flow                 RI        PI       Comments

Impression

Single living intrauterine pregnancy at 33w 3d.
Cephalic presentation.
Placenta Posterior Fundal, above cervical os.
Normal amniotic fluid volume.
Normal appearing interval fetal anatomy.
Right anterior leiomyomata as noted above
BPP [DATE] with an AFI > 23 cm
Recommendations

Recommend follow-up ultrasound examination serially for
growth and antenatal testing as clinically indicated for insulin
requiring gestational diabetes
She notes that she will be scheduled for these at her local
hospital

## 2018-10-03 IMAGING — US US MFM OB FOLLOW-UP
1 series · 14 of 28 positions shown · non-contrast
Comparison: none

[Series 1: us mfm ob follow-up · 46 acquisitions, 14 frames shown]
[im 2/46]
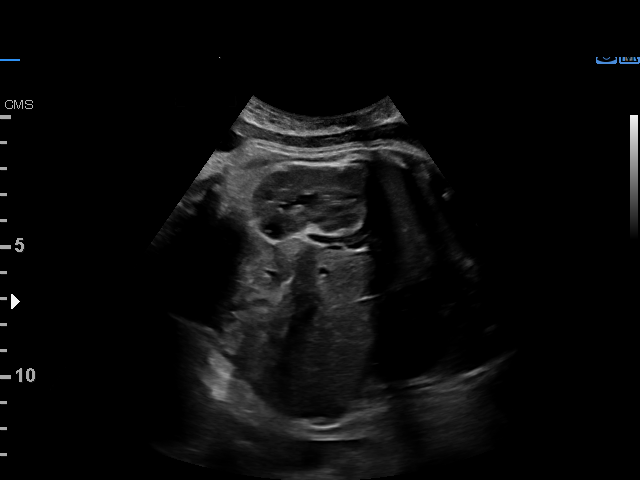
[im 6/46]
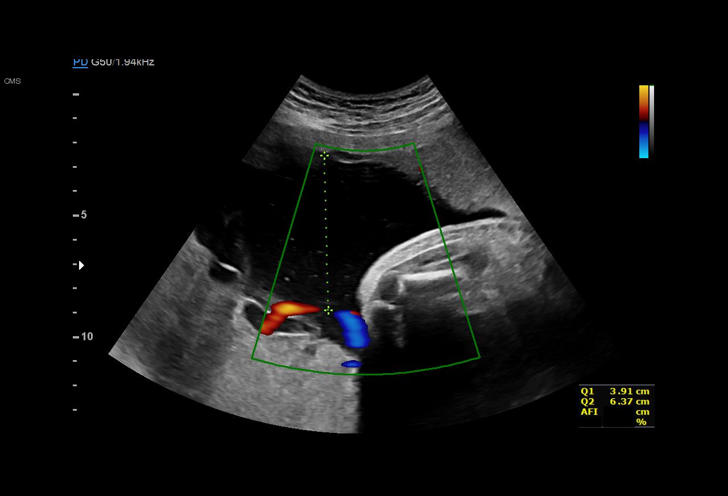
[im 9/46]
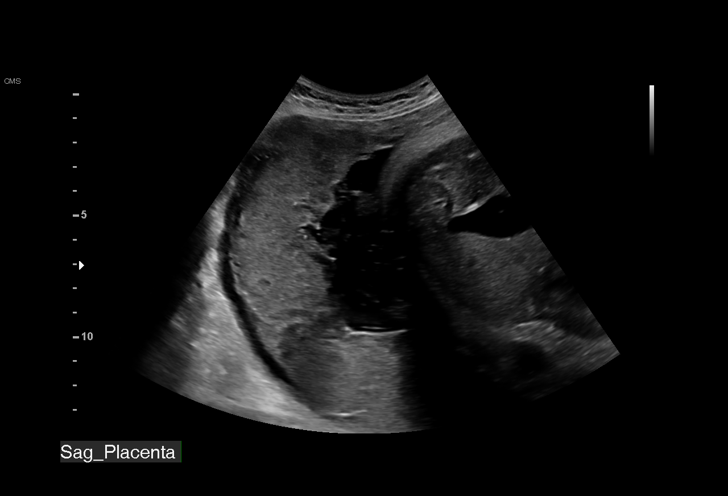
[im 12/46]
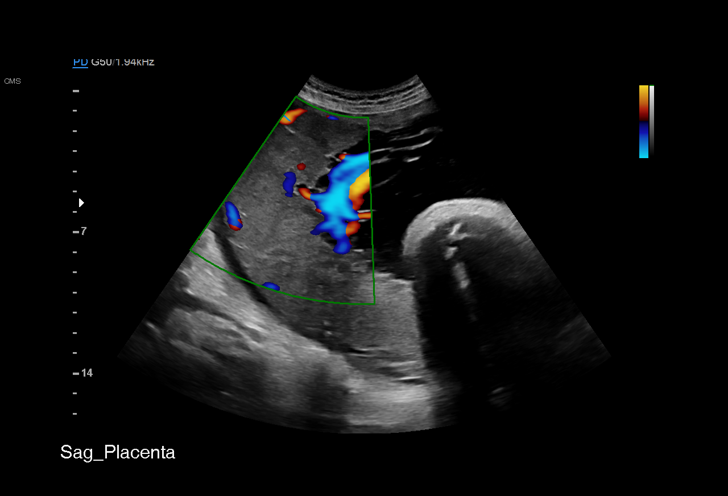
[im 16/46]
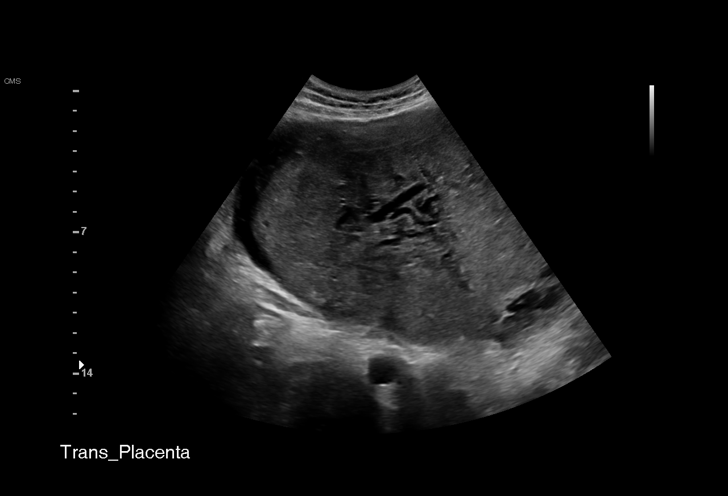
[im 19/46]
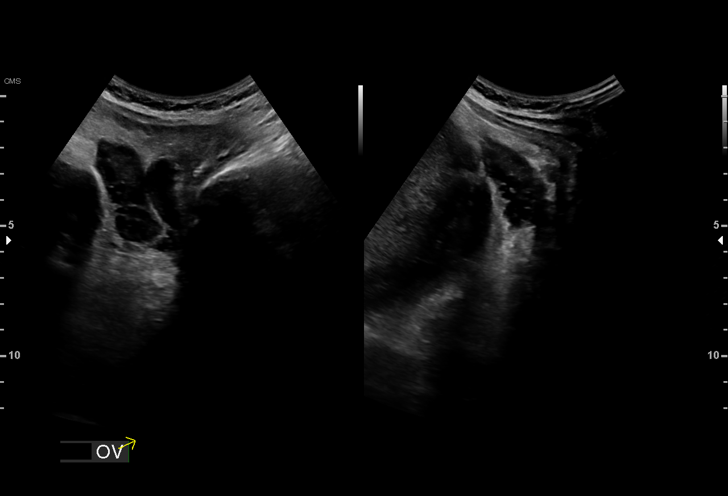
[im 22/46]
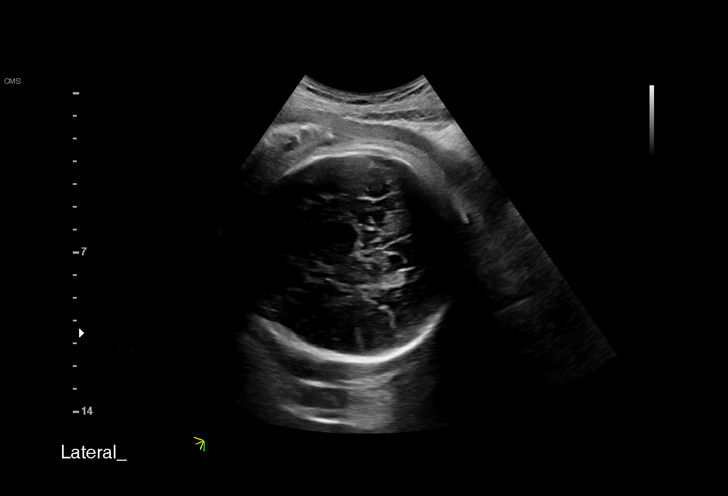
[im 26/46]
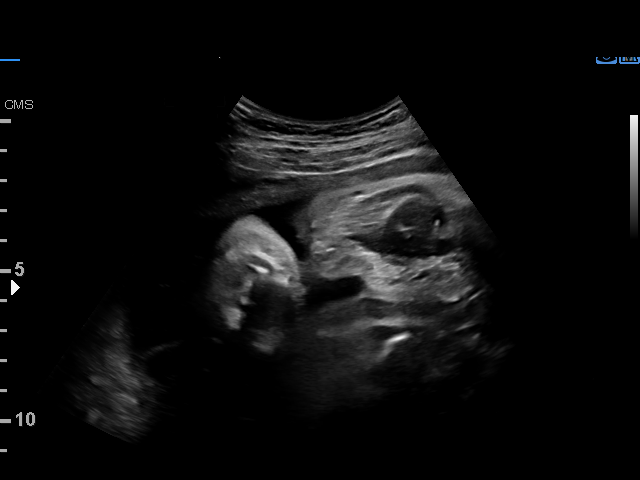
[im 29/46]
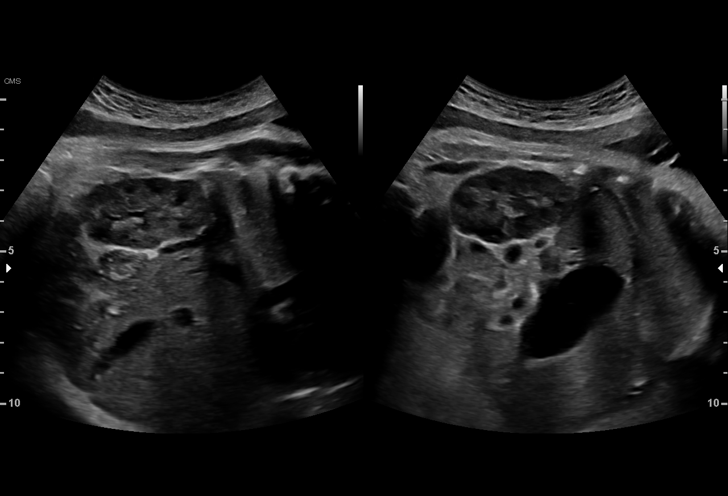
[im 32/46]
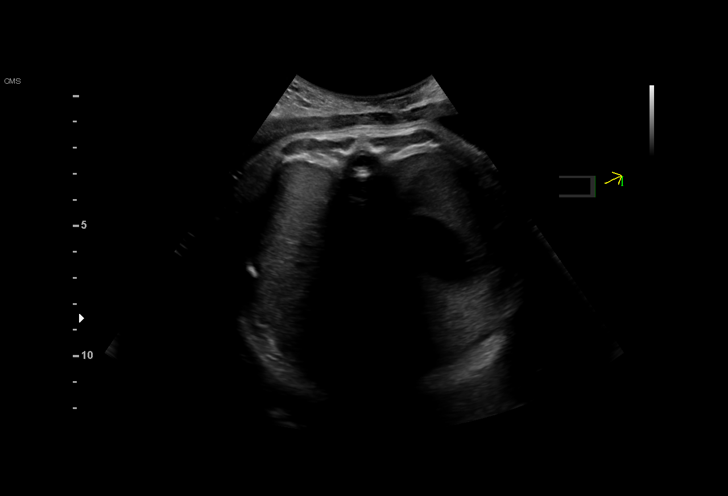
[im 36/46]
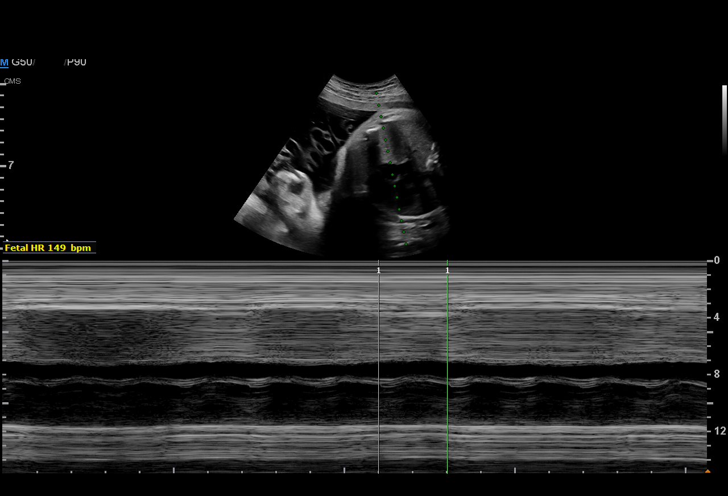
[im 39/46]
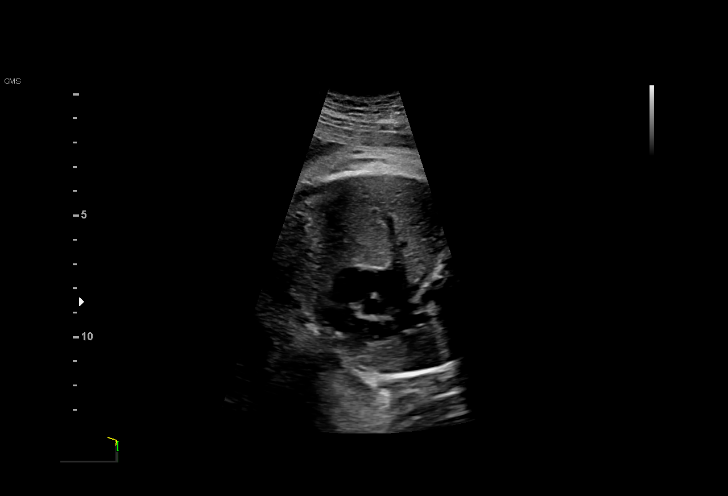
[im 42/46]
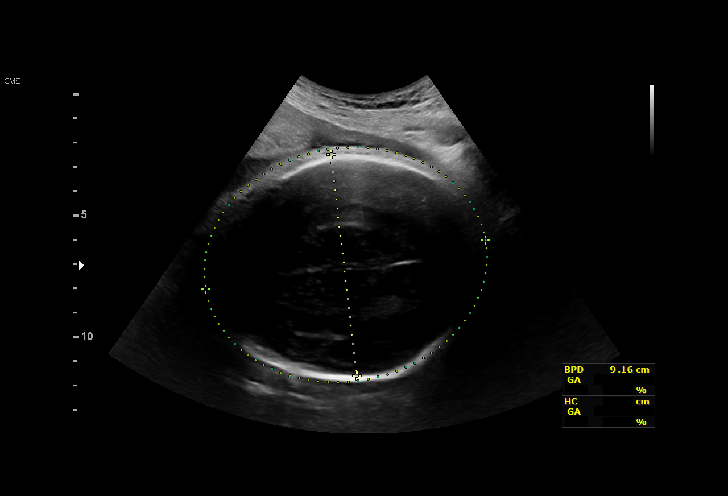
[im 46/46]
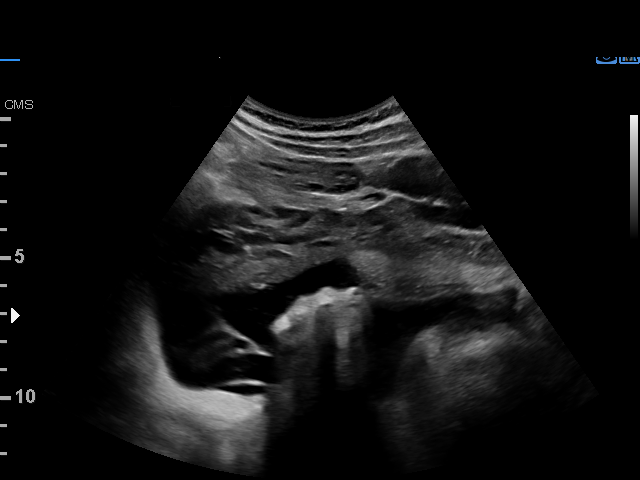

[14 of 28 positions shown; findings below may reference images not displayed]

1  LILKARARE BN JABAL            [id number]      [id number]     [id number]
2  HEIDEROSE ROQUE           [id number]      [id number]     [id number]
Indications

36 weeks gestation of pregnancy
Gestational diabetes in pregnancy, insulin
controlled
Encounter for other antenatal screening
follow-up
OB History

Gravidity:    3         Term:   2
Living:       2
Fetal Evaluation

Num Of Fetuses:     1
Fetal Heart         149
Rate(bpm):
Cardiac Activity:   Observed
Presentation:       Cephalic
Placenta:           Posterior, above cervical os

Amniotic Fluid
AFI FV:      Subjectively within normal limits

AFI Sum(cm)     %Tile       Largest Pocket(cm)
15.8            58

RUQ(cm)                     LUQ(cm)        LLQ(cm)
3.91
Biophysical Evaluation
Amniotic F.V:   Within normal limits       F. Tone:        Observed
F. Movement:    Observed                   Score:          [DATE]
F. Breathing:   Observed
Biometry

BPD:      91.9  mm     G. Age:  37w 2d         85  %    CI:        76.29   %    70 - 86
FL/HC:      21.0   %    20.1 -
HC:      333.4  mm     G. Age:  38w 1d         65  %    HC/AC:      0.90        0.93 -
AC:      372.5  mm     G. Age:  41w 1d       > 97  %    FL/BPD:     76.1   %    71 - 87
FL:       69.9  mm     G. Age:  35w 6d         36  %    FL/AC:      18.8   %    20 - 24
HUM:      61.4  mm     G. Age:  35w 4d         51  %

Est. FW:    5158  gm      8 lb 4 oz   > 90  %
Gestational Age

LMP:           37w 2d        Date:  11/18/16                 EDD:   08/25/17
U/S Today:     38w 1d                                        EDD:   08/19/17
Best:          36w 2d     Det. By:  Early Ultrasound         EDD:   09/01/17
(01/31/17)
Anatomy

Cranium:               Appears normal         Aortic Arch:            Appears normal
Cavum:                 Previously seen        Ductal Arch:            Previously seen
Ventricles:            Appears normal         Diaphragm:              Appears normal
Choroid Plexus:        Appears normal         Stomach:                Appears normal, left
sided
Cerebellum:            Appears normal         Abdomen:                Appears normal
Posterior Fossa:       Previously seen        Abdominal Wall:         Not well visualized
Nuchal Fold:           Not applicable (>20    Cord Vessels:           Previously seen
wks GA)
Face:                  Orbits and profile     Kidneys:                Appear normal
previously seen
Lips:                  Previously seen        Bladder:                Appears normal
Thoracic:              Appears normal         Spine:                  Previously seen
Heart:                 Previously seen        Upper Extremities:      Previously seen
RVOT:                  Appears normal         Lower Extremities:      Previously seen
LVOT:                  Appears normal

Other:  Nasal bone previously visualized.
Cervix Uterus Adnexa

Cervix
Not visualized (advanced GA >51wks)

Uterus
No abnormality visualized.

Left Ovary
Within normal limits.

Right Ovary
Within normal limits.

Cul De Sac:   No free fluid seen.

Adnexa:       No abnormality visualized.
Impression

Singleton intrauterine pregnancy at 36+2 weeks with GDM on
insulin here for growth evaluation and BPP
Interval review of the anatomy shows no sonographic
markers for aneuploidy or structural anomalies
All relevant fetal anatomy has been visualized
Amniotic fluid volume is normal
Estimated fetal weight shows growth at >90th percentile
BPP [DATE]
Recommendations

Consider repeat evaluation of growth in 2-3 weeks if
undelivered
Continue antepartum testing through delivery
